# Patient Record
Sex: Male | Born: 1950
Health system: Southern US, Community
[De-identification: ages and names within clinical notes are randomized; demographics above are authoritative.]

## PROBLEM LIST (undated history)

## (undated) DIAGNOSIS — N200 Calculus of kidney: Secondary | ICD-10-CM

## (undated) HISTORY — PX: KNEE ARTHROSCOPY: SUR90

## (undated) HISTORY — PX: LITHOTRIPSY: SUR834

---

## 2003-12-28 ENCOUNTER — Encounter: Admission: RE | Admit: 2003-12-28 | Discharge: 2003-12-28 | Payer: Self-pay | Admitting: Urology

## 2003-12-29 ENCOUNTER — Ambulatory Visit (HOSPITAL_BASED_OUTPATIENT_CLINIC_OR_DEPARTMENT_OTHER): Admission: RE | Admit: 2003-12-29 | Discharge: 2003-12-29 | Payer: Self-pay | Admitting: Urology

## 2003-12-29 ENCOUNTER — Ambulatory Visit (HOSPITAL_COMMUNITY): Admission: RE | Admit: 2003-12-29 | Discharge: 2003-12-29 | Payer: Self-pay | Admitting: Urology

## 2004-01-01 ENCOUNTER — Emergency Department (HOSPITAL_COMMUNITY): Admission: EM | Admit: 2004-01-01 | Discharge: 2004-01-01 | Payer: Self-pay | Admitting: Emergency Medicine

## 2005-08-06 IMAGING — CR DG CHEST 2V
3 series · 3 of 3 positions shown · non-contrast
Comparison: none

CLINICAL DATA: Pre op renal calculi.

[view not recorded (1 of 3)]
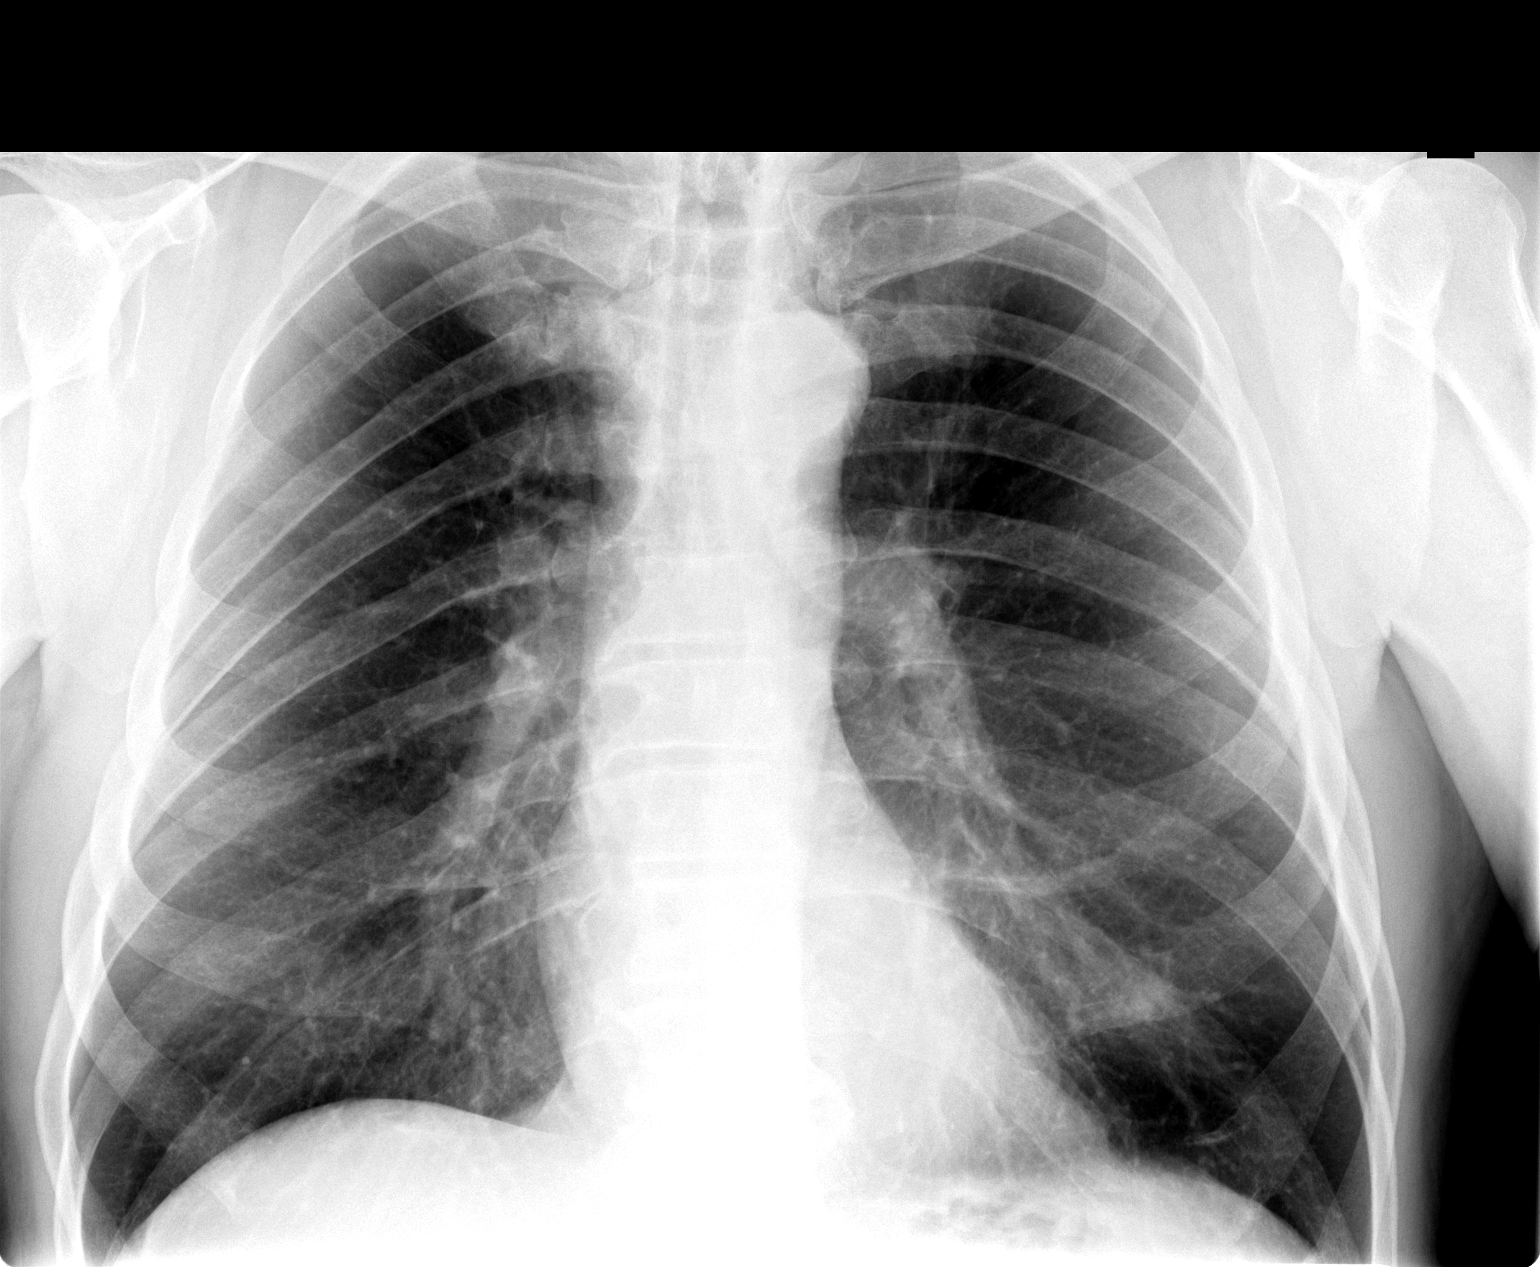

[view not recorded (2 of 3)]
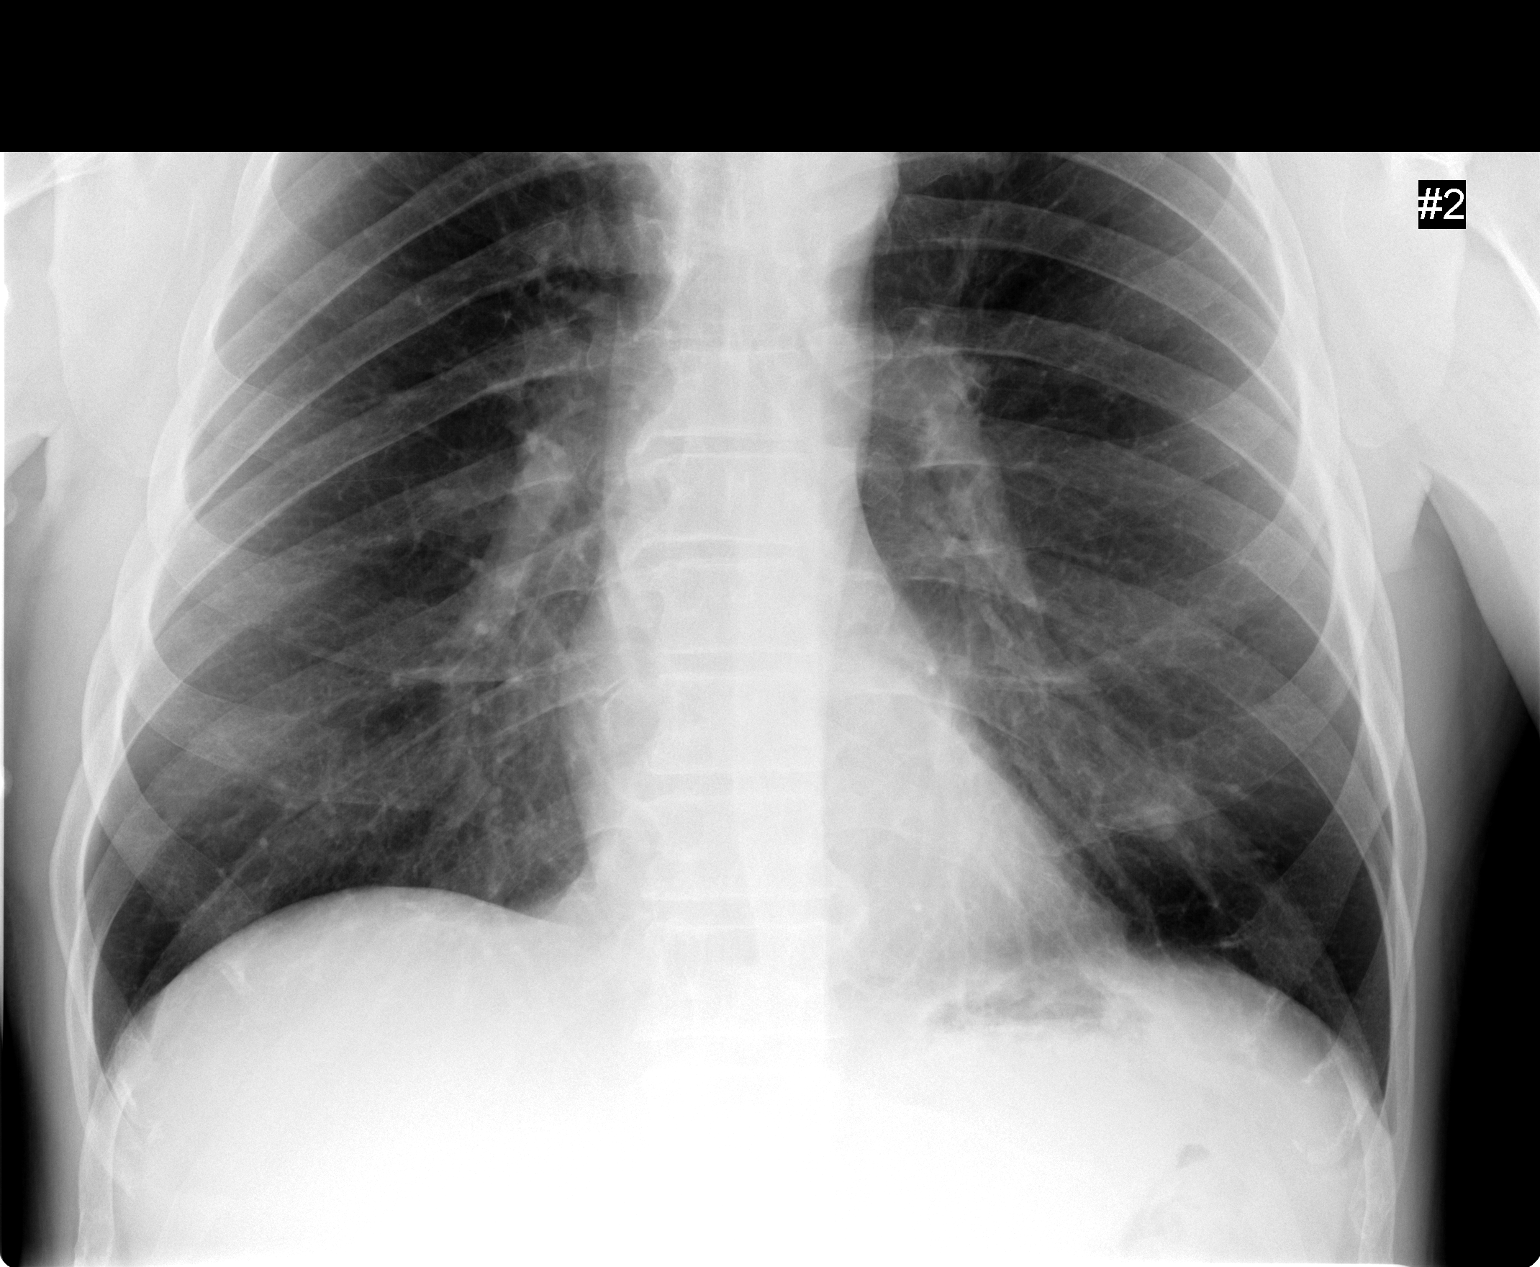

[view not recorded (3 of 3)]
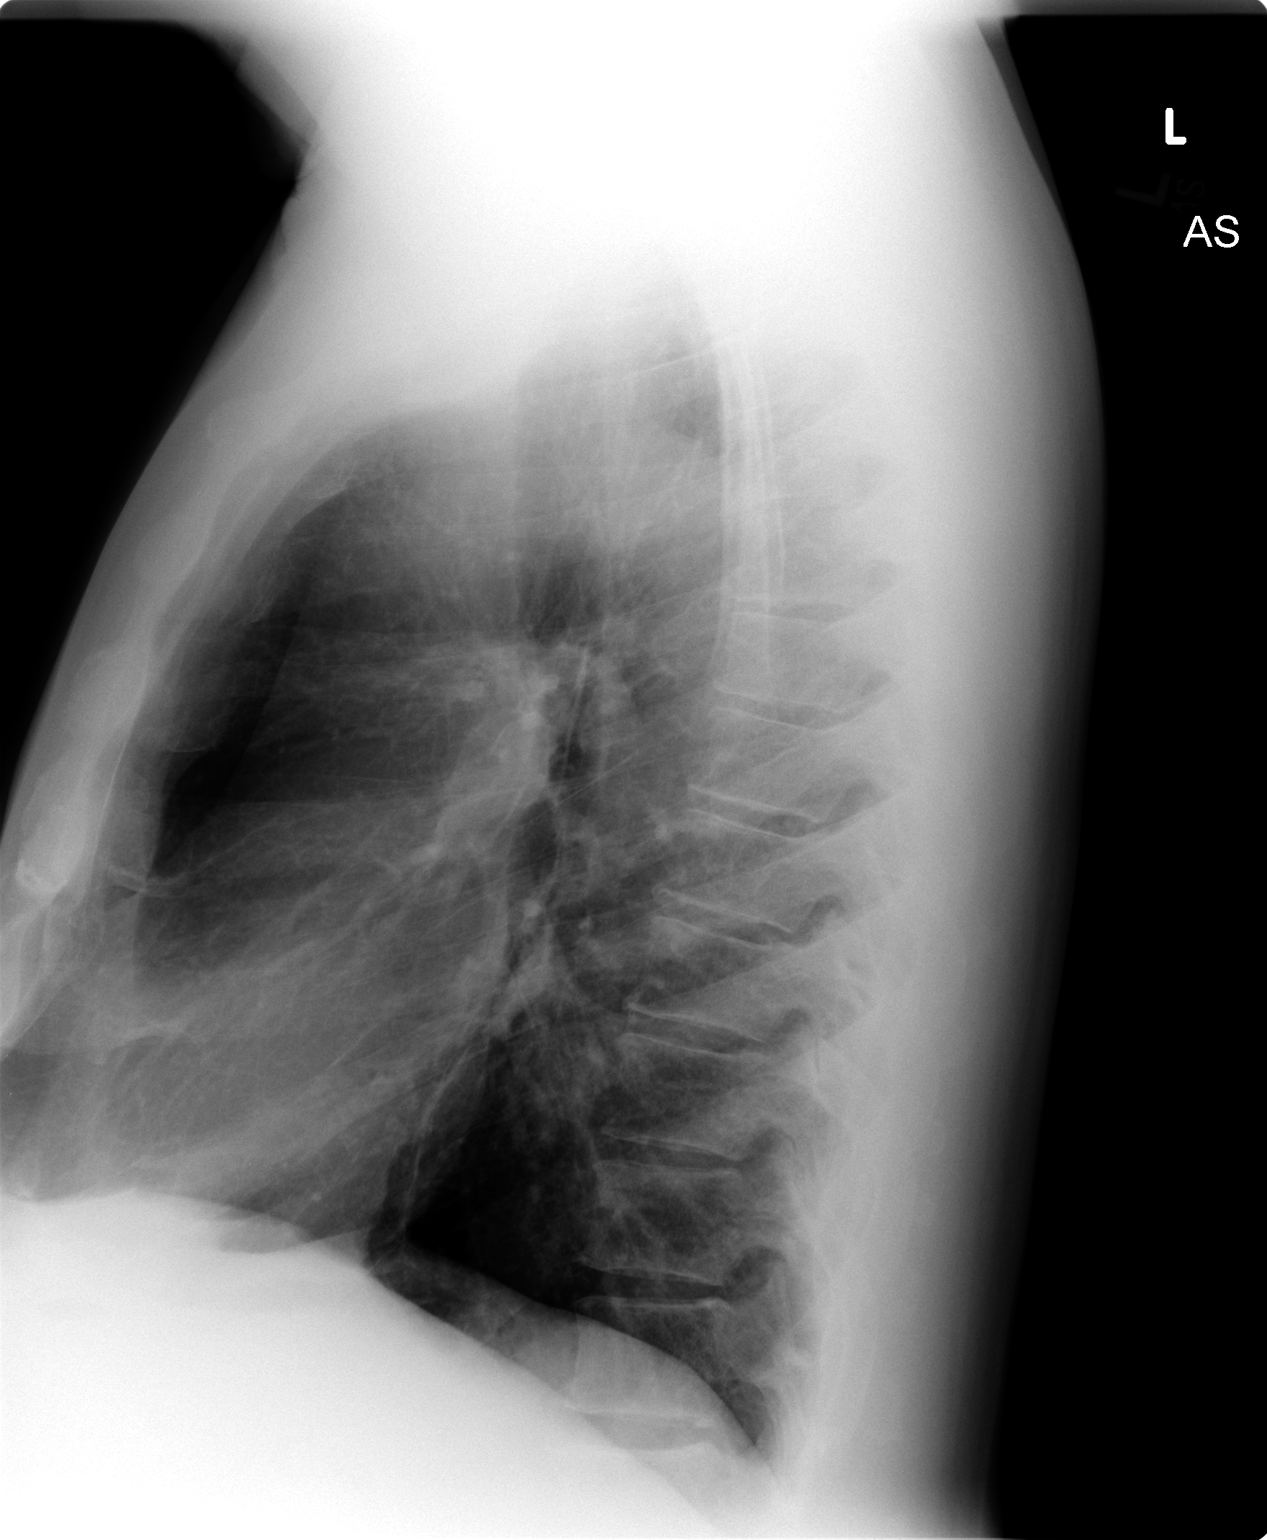

[3 of 3 positions shown; findings below may reference images not displayed]

TWO VIEW CHEST 
 Two view chest with no priors for comparison:

 Heart size and contour within normal limits.  Lungs mildly hyperaerated.  Probable left nipple shadow simulating lung nodule.  A PA film with nipple markers will be obtained since there are no priors.  

 IMPRESSION
 1.  Chronic lung changes.
 2.  Probable left nipple shadow simulating left lower lobe nodule.  Repeat PA with nipple markers will be done.

## 2005-08-06 IMAGING — CR DG CHEST SPECIAL VIEW
1 series · 1 of 1 positions shown · non-contrast
Comparison: none

CLINICAL DATA: Probable left nipple shadow simulated lung nodule on earlier PA and lateral chest x-ray. 
 PA CHEST W/NIPPLE MARKERS: 
 Repeat PA film with nipple markers confirms that the left nipple is the cause of the nodular shadow projecting over the left base.  No left lower lobe nodule.

[view not recorded]
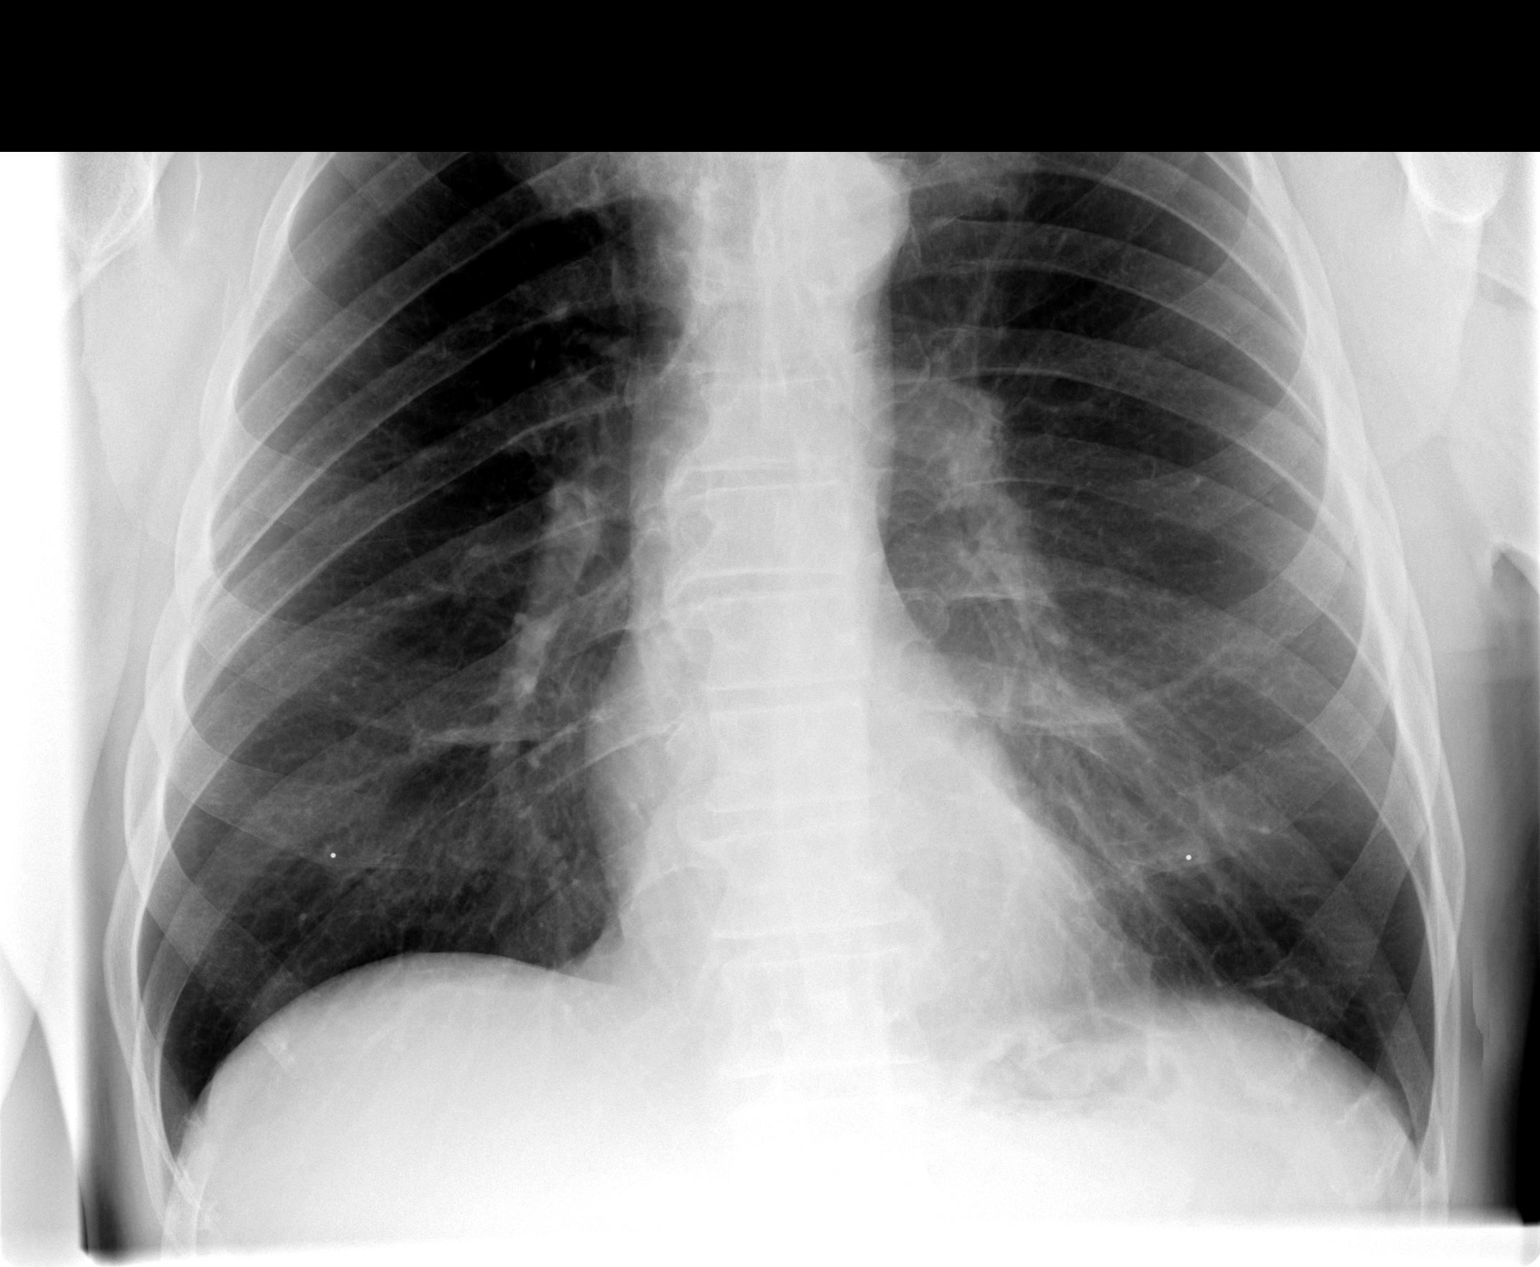

[1 of 1 positions shown; findings below may reference images not displayed]

IMPRESSION: The left nipple shadow simulates a left lower lobe nodule ? no active disease.

## 2005-08-10 IMAGING — CT CT PELVIS W/O CM
1 series · 15 of 32 positions shown, 19 images · non-contrast
Comparison: None.

CLINICAL DATA: Right flank pain, status-post right urinary tract calculus lithotripsy 3 days ago.  Now with gross hematuria.  Double J stent removed yesterday.  
CT OF THE ABDOMEN AND PELVIS, WITHOUT CONTRAST (URINARY CALCULUS PROTOCOL) ? 01/01/04

[Series 2: kidney sto 5.0 b30f · axial · 0.73mm/px · z∈[-616,-296]mm · 15 of 89 slices shown, 19 images]
[im 6/89  soft-tissue]
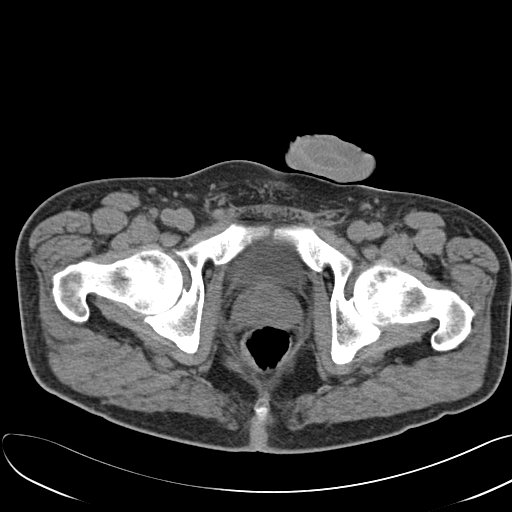
[im 6/89  bone]
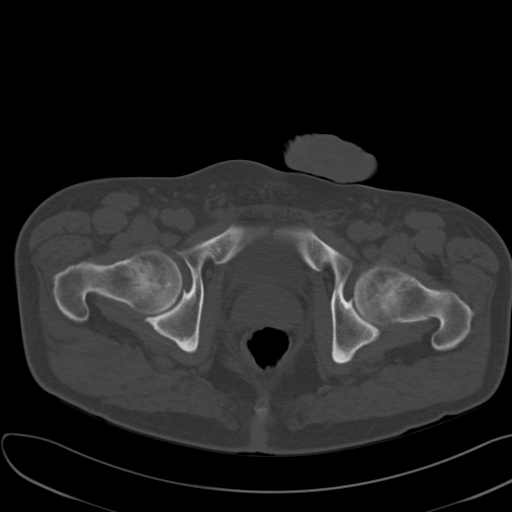
[im 12/89  soft-tissue]
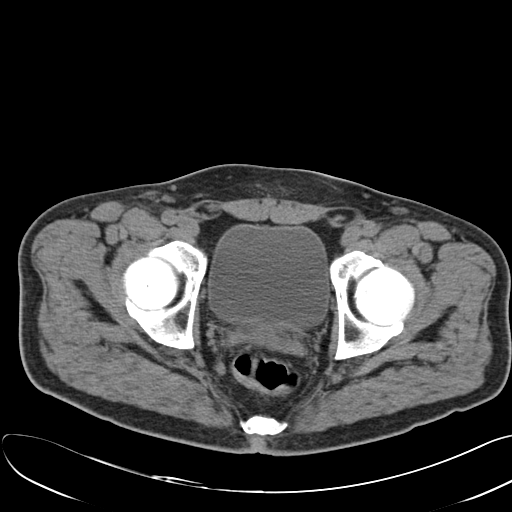
[im 18/89  soft-tissue]
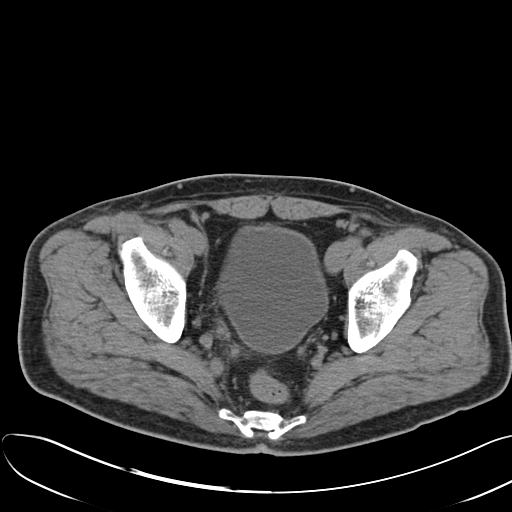
[im 26/89  soft-tissue]
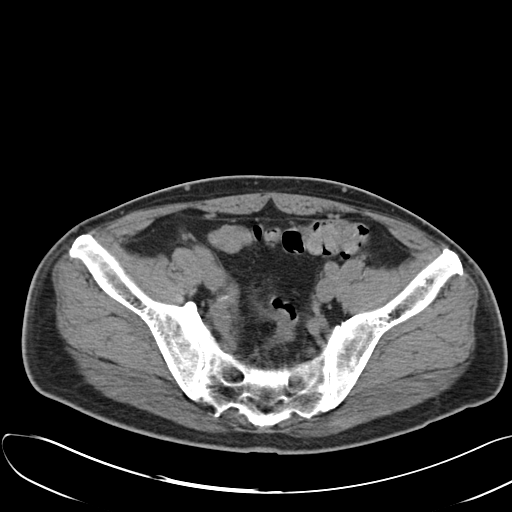
[im 32/89  soft-tissue]
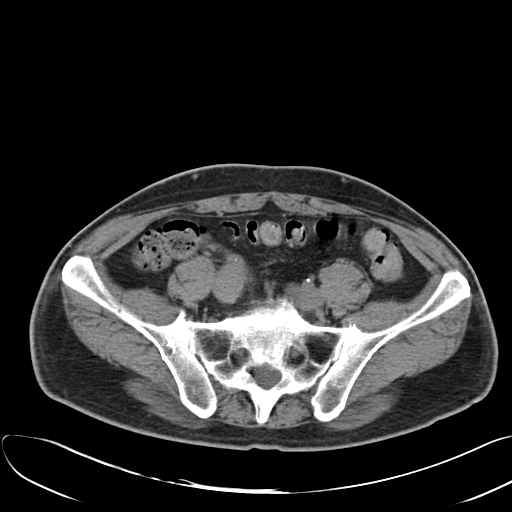
[im 37/89  soft-tissue]
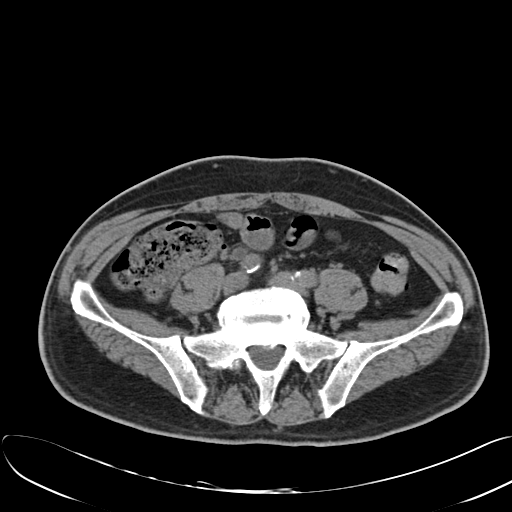
[im 46/89  soft-tissue]
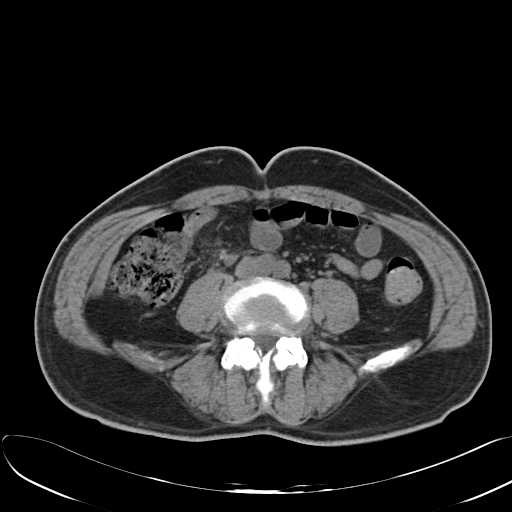
[im 52/89  soft-tissue]
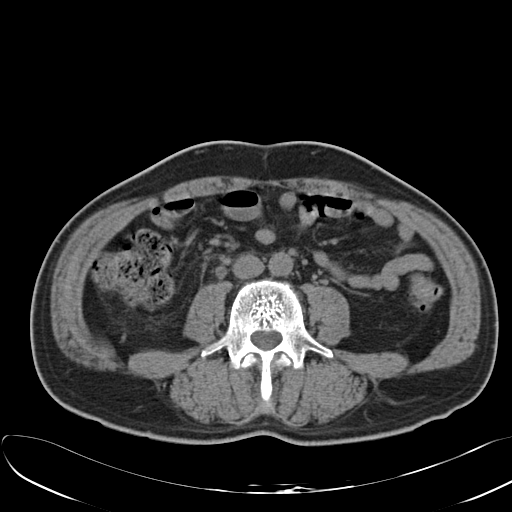
[im 57/89  soft-tissue]
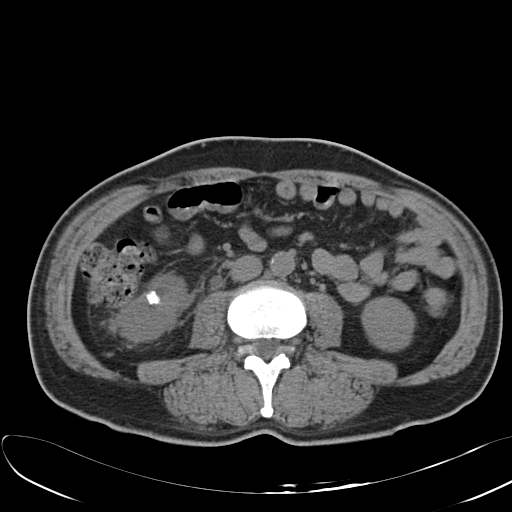
[im 57/89  bone]
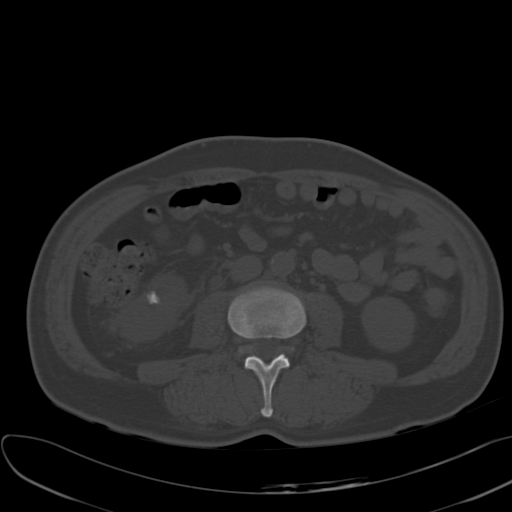
[im 63/89  soft-tissue]
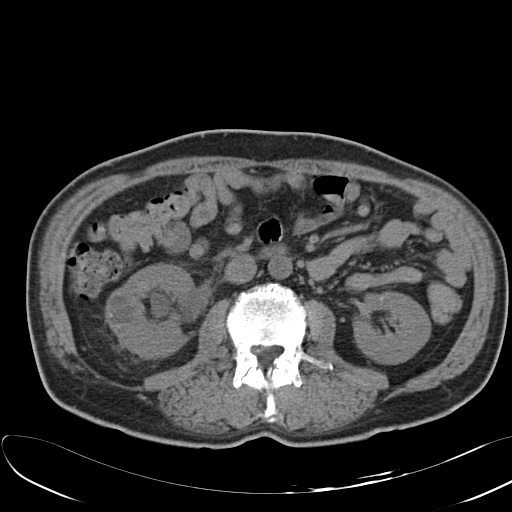
[im 71/89  soft-tissue]
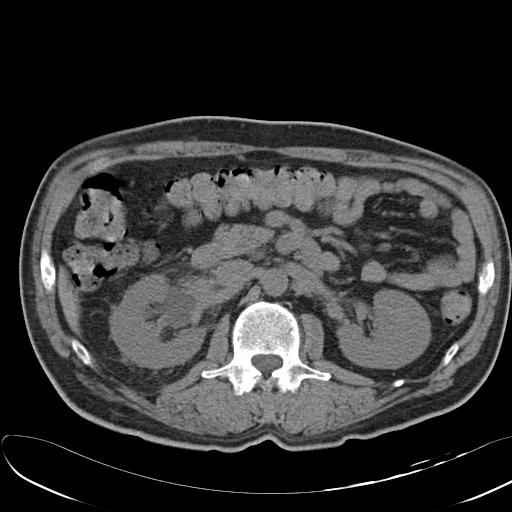
[im 77/89  soft-tissue]
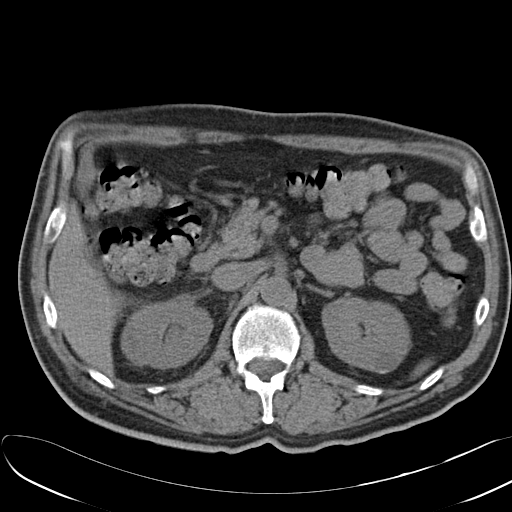
[im 77/89  lung]
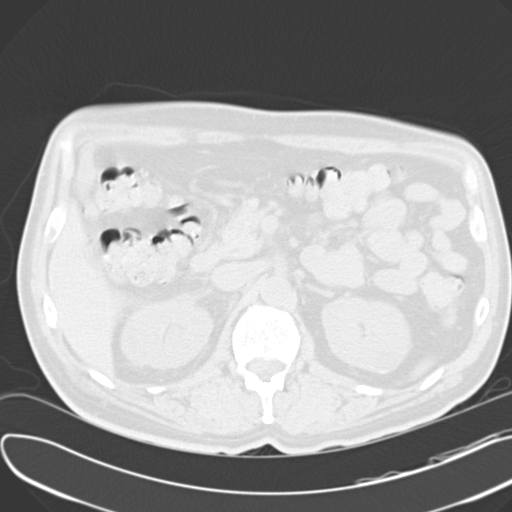
[im 80/89  lung]
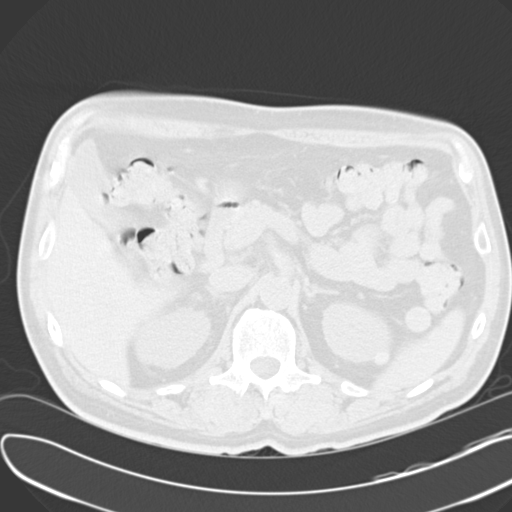
[im 83/89  soft-tissue]
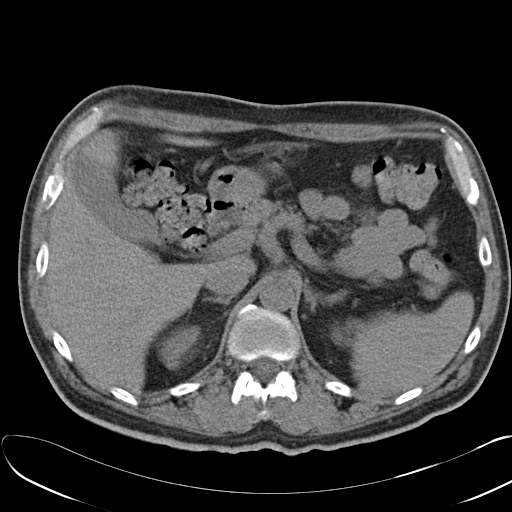
[im 83/89  lung]
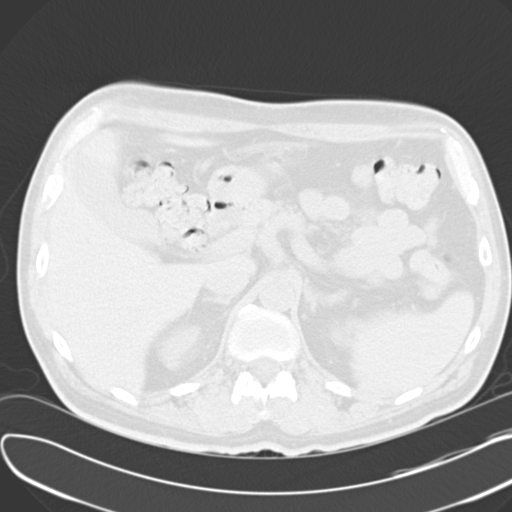
[im 86/89  lung]
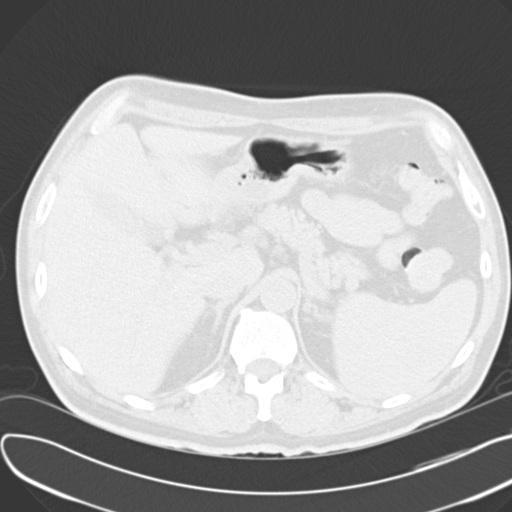

[15 of 32 positions shown; findings below may reference images not displayed]

CT ABDOMEN
There is evidence of moderate hydronephrosis involving the right kidney.  Numerous right renal stone fragments are present.  The proximal right ureter is dilated though there are no proximal ureteral calculi.  No left renal or ureteral calculi are identified. There is a hyperdense lesion arising from the upper pole of the left kidney with a Hounsfield unit measurement of 75; this measures approximately 1.2 x 1.1 cm in size and is likely to represent a hemorrhagic cyst.  There is an approximate 2 cm cyst in mid right kidney.  
There is a focus of accessory splenic tissue just medial to the lower pole of the spleen.  No significant abnormalities are identified within the visualized spleen for the unenhanced technique.  Mild aortic atherosclerosis is present.  The remaining visualized upper abdomen is unremarkable. 
IMPRESSION 
Moderate right hydronephrosis.  Gas bubbles are present within the right renal pelvis which would go along with the removal of the double J stent yesterday.  No proximal right ureteral calculi. 
Multiple stone fragments within right mid and lower pole calices.  
No left upper urinary tract calculi. 
Hyperdense 1.2 cm cyst arising from the upper pole of the left kidney. 

CT PELVIS
There is a small stone fragment present within the distal right ureter, and this stone is only on the order of 1-2 mm.  No other right ureteral calculi are identified.  Mild iliac atherosclerosis is present.  The urinary bladder has a small stone within it dependently.  There is also a gas bubble in the bladder consistent with the double J stent removal yesterday. The prostate gland is normal in size and appearance. The large and small bowel are unremarkable.  
IMPRESSION 
Tiny (2mm) distal right ureteral calculus. 
Tiny stone dependently in the bladder.

## 2008-06-16 DIAGNOSIS — I10 Essential (primary) hypertension: Secondary | ICD-10-CM

## 2008-06-16 HISTORY — DX: Essential (primary) hypertension: I10

## 2008-08-03 DIAGNOSIS — E782 Mixed hyperlipidemia: Secondary | ICD-10-CM

## 2008-08-03 HISTORY — DX: Mixed hyperlipidemia: E78.2

## 2010-06-26 DIAGNOSIS — R7301 Impaired fasting glucose: Secondary | ICD-10-CM | POA: Insufficient documentation

## 2010-06-26 DIAGNOSIS — E119 Type 2 diabetes mellitus without complications: Secondary | ICD-10-CM | POA: Insufficient documentation

## 2010-06-26 HISTORY — DX: Impaired fasting glucose: R73.01

## 2011-03-26 LAB — HM COLONOSCOPY

## 2013-10-13 ENCOUNTER — Other Ambulatory Visit: Payer: Self-pay | Admitting: Urology

## 2013-10-21 ENCOUNTER — Encounter (HOSPITAL_BASED_OUTPATIENT_CLINIC_OR_DEPARTMENT_OTHER): Payer: Self-pay | Admitting: *Deleted

## 2013-10-22 DIAGNOSIS — N200 Calculus of kidney: Secondary | ICD-10-CM | POA: Insufficient documentation

## 2013-10-28 ENCOUNTER — Ambulatory Visit (HOSPITAL_BASED_OUTPATIENT_CLINIC_OR_DEPARTMENT_OTHER): Admission: RE | Admit: 2013-10-28 | Payer: Self-pay | Source: Ambulatory Visit | Admitting: Urology

## 2013-10-28 ENCOUNTER — Encounter (HOSPITAL_BASED_OUTPATIENT_CLINIC_OR_DEPARTMENT_OTHER): Admission: RE | Payer: Self-pay | Source: Ambulatory Visit

## 2013-10-28 HISTORY — DX: Calculus of kidney: N20.0

## 2013-10-28 HISTORY — PX: CYSTOSCOPY W/ URETERAL STENT PLACEMENT: SHX1429

## 2013-10-28 SURGERY — CYSTOURETEROSCOPY, WITH RETROGRADE PYELOGRAM AND STENT INSERTION
Anesthesia: General | Laterality: Right

## 2013-11-09 ENCOUNTER — Encounter (HOSPITAL_BASED_OUTPATIENT_CLINIC_OR_DEPARTMENT_OTHER): Admission: RE | Payer: Self-pay | Source: Ambulatory Visit

## 2013-11-09 ENCOUNTER — Ambulatory Visit (HOSPITAL_BASED_OUTPATIENT_CLINIC_OR_DEPARTMENT_OTHER): Admission: RE | Admit: 2013-11-09 | Payer: Self-pay | Source: Ambulatory Visit | Admitting: Urology

## 2013-11-09 SURGERY — CYSTOURETEROSCOPY, WITH RETROGRADE PYELOGRAM AND STENT INSERTION
Anesthesia: General | Laterality: Right

## 2015-09-26 DIAGNOSIS — R7301 Impaired fasting glucose: Secondary | ICD-10-CM | POA: Diagnosis not present

## 2015-09-26 DIAGNOSIS — E782 Mixed hyperlipidemia: Secondary | ICD-10-CM | POA: Diagnosis not present

## 2015-09-26 DIAGNOSIS — I10 Essential (primary) hypertension: Secondary | ICD-10-CM | POA: Diagnosis not present

## 2015-12-04 DIAGNOSIS — J069 Acute upper respiratory infection, unspecified: Secondary | ICD-10-CM | POA: Diagnosis not present

## 2016-03-24 DIAGNOSIS — A849 Tick-borne viral encephalitis, unspecified: Secondary | ICD-10-CM | POA: Diagnosis not present

## 2016-03-24 DIAGNOSIS — S2096XA Insect bite (nonvenomous) of unspecified parts of thorax, initial encounter: Secondary | ICD-10-CM | POA: Diagnosis not present

## 2016-03-24 DIAGNOSIS — M150 Primary generalized (osteo)arthritis: Secondary | ICD-10-CM | POA: Diagnosis not present

## 2016-03-26 DIAGNOSIS — Z23 Encounter for immunization: Secondary | ICD-10-CM | POA: Diagnosis not present

## 2016-03-26 DIAGNOSIS — E782 Mixed hyperlipidemia: Secondary | ICD-10-CM | POA: Diagnosis not present

## 2016-03-26 DIAGNOSIS — I10 Essential (primary) hypertension: Secondary | ICD-10-CM | POA: Diagnosis not present

## 2016-03-26 DIAGNOSIS — M25562 Pain in left knee: Secondary | ICD-10-CM | POA: Diagnosis not present

## 2016-03-26 DIAGNOSIS — M25511 Pain in right shoulder: Secondary | ICD-10-CM | POA: Diagnosis not present

## 2016-03-26 DIAGNOSIS — R7301 Impaired fasting glucose: Secondary | ICD-10-CM | POA: Diagnosis not present

## 2016-03-26 DIAGNOSIS — M25512 Pain in left shoulder: Secondary | ICD-10-CM | POA: Diagnosis not present

## 2016-04-21 DIAGNOSIS — J189 Pneumonia, unspecified organism: Secondary | ICD-10-CM | POA: Diagnosis not present

## 2016-05-23 DIAGNOSIS — M1712 Unilateral primary osteoarthritis, left knee: Secondary | ICD-10-CM | POA: Diagnosis not present

## 2016-05-23 DIAGNOSIS — M25562 Pain in left knee: Secondary | ICD-10-CM | POA: Diagnosis not present

## 2016-05-24 DIAGNOSIS — E663 Overweight: Secondary | ICD-10-CM | POA: Diagnosis not present

## 2016-05-24 DIAGNOSIS — B351 Tinea unguium: Secondary | ICD-10-CM | POA: Diagnosis not present

## 2016-05-24 DIAGNOSIS — Z6832 Body mass index (BMI) 32.0-32.9, adult: Secondary | ICD-10-CM | POA: Diagnosis not present

## 2016-05-24 DIAGNOSIS — Z0001 Encounter for general adult medical examination with abnormal findings: Secondary | ICD-10-CM | POA: Diagnosis not present

## 2016-06-25 DIAGNOSIS — M1712 Unilateral primary osteoarthritis, left knee: Secondary | ICD-10-CM | POA: Diagnosis not present

## 2016-07-02 DIAGNOSIS — M19012 Primary osteoarthritis, left shoulder: Secondary | ICD-10-CM | POA: Diagnosis not present

## 2016-07-02 DIAGNOSIS — M19011 Primary osteoarthritis, right shoulder: Secondary | ICD-10-CM | POA: Diagnosis not present

## 2016-08-24 DIAGNOSIS — R7301 Impaired fasting glucose: Secondary | ICD-10-CM | POA: Diagnosis not present

## 2016-08-24 DIAGNOSIS — I10 Essential (primary) hypertension: Secondary | ICD-10-CM | POA: Diagnosis not present

## 2016-08-24 DIAGNOSIS — E782 Mixed hyperlipidemia: Secondary | ICD-10-CM | POA: Diagnosis not present

## 2016-10-15 DIAGNOSIS — H25813 Combined forms of age-related cataract, bilateral: Secondary | ICD-10-CM | POA: Diagnosis not present

## 2016-10-22 DIAGNOSIS — R05 Cough: Secondary | ICD-10-CM | POA: Diagnosis not present

## 2016-10-22 DIAGNOSIS — R509 Fever, unspecified: Secondary | ICD-10-CM | POA: Diagnosis not present

## 2016-10-26 HISTORY — PX: EYE SURGERY: SHX253

## 2016-10-29 DIAGNOSIS — H2512 Age-related nuclear cataract, left eye: Secondary | ICD-10-CM | POA: Diagnosis not present

## 2016-10-29 DIAGNOSIS — H53001 Unspecified amblyopia, right eye: Secondary | ICD-10-CM | POA: Diagnosis not present

## 2016-10-29 DIAGNOSIS — H2511 Age-related nuclear cataract, right eye: Secondary | ICD-10-CM | POA: Diagnosis not present

## 2016-10-29 DIAGNOSIS — H02831 Dermatochalasis of right upper eyelid: Secondary | ICD-10-CM | POA: Diagnosis not present

## 2016-11-15 DIAGNOSIS — H2511 Age-related nuclear cataract, right eye: Secondary | ICD-10-CM | POA: Diagnosis not present

## 2016-11-15 DIAGNOSIS — H25811 Combined forms of age-related cataract, right eye: Secondary | ICD-10-CM | POA: Diagnosis not present

## 2016-11-25 HISTORY — PX: EYE SURGERY: SHX253

## 2016-12-03 DIAGNOSIS — R7301 Impaired fasting glucose: Secondary | ICD-10-CM | POA: Diagnosis not present

## 2016-12-03 DIAGNOSIS — E782 Mixed hyperlipidemia: Secondary | ICD-10-CM | POA: Diagnosis not present

## 2016-12-03 DIAGNOSIS — I10 Essential (primary) hypertension: Secondary | ICD-10-CM | POA: Diagnosis not present

## 2016-12-13 DIAGNOSIS — H25812 Combined forms of age-related cataract, left eye: Secondary | ICD-10-CM | POA: Diagnosis not present

## 2016-12-13 DIAGNOSIS — H2512 Age-related nuclear cataract, left eye: Secondary | ICD-10-CM | POA: Diagnosis not present

## 2016-12-14 DIAGNOSIS — I10 Essential (primary) hypertension: Secondary | ICD-10-CM | POA: Diagnosis not present

## 2016-12-14 DIAGNOSIS — E782 Mixed hyperlipidemia: Secondary | ICD-10-CM | POA: Diagnosis not present

## 2016-12-14 DIAGNOSIS — R7301 Impaired fasting glucose: Secondary | ICD-10-CM | POA: Diagnosis not present

## 2016-12-17 ENCOUNTER — Other Ambulatory Visit: Payer: Self-pay

## 2016-12-31 DIAGNOSIS — Z961 Presence of intraocular lens: Secondary | ICD-10-CM | POA: Diagnosis not present

## 2017-03-15 DIAGNOSIS — I10 Essential (primary) hypertension: Secondary | ICD-10-CM | POA: Diagnosis not present

## 2017-03-15 DIAGNOSIS — E782 Mixed hyperlipidemia: Secondary | ICD-10-CM | POA: Diagnosis not present

## 2017-03-15 DIAGNOSIS — R7301 Impaired fasting glucose: Secondary | ICD-10-CM | POA: Diagnosis not present

## 2017-03-15 DIAGNOSIS — Z6831 Body mass index (BMI) 31.0-31.9, adult: Secondary | ICD-10-CM | POA: Diagnosis not present

## 2017-03-15 DIAGNOSIS — Z23 Encounter for immunization: Secondary | ICD-10-CM | POA: Diagnosis not present

## 2017-03-15 DIAGNOSIS — E668 Other obesity: Secondary | ICD-10-CM | POA: Diagnosis not present

## 2017-03-15 DIAGNOSIS — B351 Tinea unguium: Secondary | ICD-10-CM | POA: Diagnosis not present

## 2017-08-19 DIAGNOSIS — I10 Essential (primary) hypertension: Secondary | ICD-10-CM | POA: Diagnosis not present

## 2017-08-19 DIAGNOSIS — M19012 Primary osteoarthritis, left shoulder: Secondary | ICD-10-CM | POA: Diagnosis not present

## 2017-08-19 DIAGNOSIS — E782 Mixed hyperlipidemia: Secondary | ICD-10-CM | POA: Diagnosis not present

## 2017-08-19 DIAGNOSIS — E668 Other obesity: Secondary | ICD-10-CM | POA: Diagnosis not present

## 2017-08-19 DIAGNOSIS — Z0001 Encounter for general adult medical examination with abnormal findings: Secondary | ICD-10-CM | POA: Diagnosis not present

## 2017-08-19 DIAGNOSIS — Z6831 Body mass index (BMI) 31.0-31.9, adult: Secondary | ICD-10-CM | POA: Diagnosis not present

## 2017-08-19 DIAGNOSIS — R7301 Impaired fasting glucose: Secondary | ICD-10-CM | POA: Diagnosis not present

## 2017-08-19 DIAGNOSIS — Z125 Encounter for screening for malignant neoplasm of prostate: Secondary | ICD-10-CM | POA: Diagnosis not present

## 2017-09-06 DIAGNOSIS — I10 Essential (primary) hypertension: Secondary | ICD-10-CM | POA: Diagnosis not present

## 2017-09-06 DIAGNOSIS — N200 Calculus of kidney: Secondary | ICD-10-CM | POA: Diagnosis not present

## 2017-09-06 DIAGNOSIS — N400 Enlarged prostate without lower urinary tract symptoms: Secondary | ICD-10-CM | POA: Diagnosis not present

## 2017-09-07 DIAGNOSIS — S161XXA Strain of muscle, fascia and tendon at neck level, initial encounter: Secondary | ICD-10-CM | POA: Diagnosis not present

## 2017-10-12 DIAGNOSIS — J069 Acute upper respiratory infection, unspecified: Secondary | ICD-10-CM | POA: Diagnosis not present

## 2017-10-12 DIAGNOSIS — R05 Cough: Secondary | ICD-10-CM | POA: Diagnosis not present

## 2017-10-18 DIAGNOSIS — Z961 Presence of intraocular lens: Secondary | ICD-10-CM | POA: Diagnosis not present

## 2017-11-16 DIAGNOSIS — J0100 Acute maxillary sinusitis, unspecified: Secondary | ICD-10-CM | POA: Diagnosis not present

## 2017-11-16 DIAGNOSIS — J069 Acute upper respiratory infection, unspecified: Secondary | ICD-10-CM | POA: Diagnosis not present

## 2017-12-20 DIAGNOSIS — R05 Cough: Secondary | ICD-10-CM | POA: Diagnosis not present

## 2017-12-20 DIAGNOSIS — J301 Allergic rhinitis due to pollen: Secondary | ICD-10-CM | POA: Diagnosis not present

## 2018-02-21 DIAGNOSIS — R05 Cough: Secondary | ICD-10-CM | POA: Diagnosis not present

## 2018-02-21 DIAGNOSIS — Z23 Encounter for immunization: Secondary | ICD-10-CM | POA: Diagnosis not present

## 2018-02-21 DIAGNOSIS — R7301 Impaired fasting glucose: Secondary | ICD-10-CM | POA: Diagnosis not present

## 2018-02-21 DIAGNOSIS — Z6831 Body mass index (BMI) 31.0-31.9, adult: Secondary | ICD-10-CM | POA: Diagnosis not present

## 2018-02-21 DIAGNOSIS — E668 Other obesity: Secondary | ICD-10-CM | POA: Diagnosis not present

## 2018-02-21 DIAGNOSIS — E782 Mixed hyperlipidemia: Secondary | ICD-10-CM | POA: Diagnosis not present

## 2018-02-21 DIAGNOSIS — I10 Essential (primary) hypertension: Secondary | ICD-10-CM | POA: Diagnosis not present

## 2018-06-13 DIAGNOSIS — M7060 Trochanteric bursitis, unspecified hip: Secondary | ICD-10-CM | POA: Diagnosis not present

## 2018-07-07 DIAGNOSIS — M25511 Pain in right shoulder: Secondary | ICD-10-CM | POA: Diagnosis not present

## 2018-07-12 DIAGNOSIS — M25511 Pain in right shoulder: Secondary | ICD-10-CM | POA: Diagnosis not present

## 2018-07-12 DIAGNOSIS — M19011 Primary osteoarthritis, right shoulder: Secondary | ICD-10-CM | POA: Diagnosis not present

## 2018-07-14 DIAGNOSIS — M25511 Pain in right shoulder: Secondary | ICD-10-CM | POA: Diagnosis not present

## 2018-07-14 DIAGNOSIS — M19011 Primary osteoarthritis, right shoulder: Secondary | ICD-10-CM | POA: Diagnosis not present

## 2018-07-14 DIAGNOSIS — M7540 Impingement syndrome of unspecified shoulder: Secondary | ICD-10-CM | POA: Diagnosis not present

## 2018-07-15 DIAGNOSIS — M25511 Pain in right shoulder: Secondary | ICD-10-CM | POA: Diagnosis not present

## 2018-07-15 DIAGNOSIS — M6281 Muscle weakness (generalized): Secondary | ICD-10-CM | POA: Diagnosis not present

## 2018-07-15 DIAGNOSIS — M19011 Primary osteoarthritis, right shoulder: Secondary | ICD-10-CM | POA: Diagnosis not present

## 2018-08-01 DIAGNOSIS — M7060 Trochanteric bursitis, unspecified hip: Secondary | ICD-10-CM | POA: Diagnosis not present

## 2018-08-01 DIAGNOSIS — M7062 Trochanteric bursitis, left hip: Secondary | ICD-10-CM | POA: Diagnosis not present

## 2018-08-15 DIAGNOSIS — M19012 Primary osteoarthritis, left shoulder: Secondary | ICD-10-CM | POA: Insufficient documentation

## 2018-08-25 DIAGNOSIS — Z0001 Encounter for general adult medical examination with abnormal findings: Secondary | ICD-10-CM | POA: Diagnosis not present

## 2018-08-25 DIAGNOSIS — M1712 Unilateral primary osteoarthritis, left knee: Secondary | ICD-10-CM | POA: Insufficient documentation

## 2018-08-25 DIAGNOSIS — M19011 Primary osteoarthritis, right shoulder: Secondary | ICD-10-CM | POA: Diagnosis not present

## 2018-10-31 DIAGNOSIS — E782 Mixed hyperlipidemia: Secondary | ICD-10-CM | POA: Diagnosis not present

## 2018-10-31 DIAGNOSIS — R7301 Impaired fasting glucose: Secondary | ICD-10-CM | POA: Diagnosis not present

## 2018-10-31 DIAGNOSIS — M79605 Pain in left leg: Secondary | ICD-10-CM | POA: Diagnosis not present

## 2018-10-31 DIAGNOSIS — I10 Essential (primary) hypertension: Secondary | ICD-10-CM | POA: Diagnosis not present

## 2018-10-31 DIAGNOSIS — Z6830 Body mass index (BMI) 30.0-30.9, adult: Secondary | ICD-10-CM | POA: Diagnosis not present

## 2018-12-01 DIAGNOSIS — N400 Enlarged prostate without lower urinary tract symptoms: Secondary | ICD-10-CM | POA: Diagnosis not present

## 2018-12-01 DIAGNOSIS — N200 Calculus of kidney: Secondary | ICD-10-CM | POA: Diagnosis not present

## 2019-05-04 DIAGNOSIS — Z6830 Body mass index (BMI) 30.0-30.9, adult: Secondary | ICD-10-CM | POA: Diagnosis not present

## 2019-05-04 DIAGNOSIS — I10 Essential (primary) hypertension: Secondary | ICD-10-CM | POA: Diagnosis not present

## 2019-05-04 DIAGNOSIS — Z23 Encounter for immunization: Secondary | ICD-10-CM | POA: Diagnosis not present

## 2019-05-04 DIAGNOSIS — R7301 Impaired fasting glucose: Secondary | ICD-10-CM | POA: Diagnosis not present

## 2019-05-04 DIAGNOSIS — E782 Mixed hyperlipidemia: Secondary | ICD-10-CM | POA: Diagnosis not present

## 2019-09-25 ENCOUNTER — Ambulatory Visit (INDEPENDENT_AMBULATORY_CARE_PROVIDER_SITE_OTHER): Payer: Medicare Other

## 2019-09-25 ENCOUNTER — Other Ambulatory Visit: Payer: Self-pay

## 2019-09-25 DIAGNOSIS — Z23 Encounter for immunization: Secondary | ICD-10-CM

## 2019-09-25 DIAGNOSIS — Z0000 Encounter for general adult medical examination without abnormal findings: Secondary | ICD-10-CM

## 2019-09-25 NOTE — Progress Notes (Signed)
Subjective:   Joel Harding is a 69 y.o. male who presents for Medicare Annual/Subsequent preventive examination.  This wellness visit is conducted by a nurse.  The patient's medications were reviewed and reconciled since the patient's last visit.  History details were provided by the patient.  The history appears to be reliable.    Patient's last AWV was one year ago.   Medical History: Patient history and Family history was reviewed  Medications, Allergies, and preventative health maintenance was reviewed and updated.  Review of Systems:  Review of Systems  Constitutional: Negative.   HENT: Positive for hearing loss. Negative for congestion and sinus pain.        Hearing loss corrected with bilateral hearing aids  Eyes: Negative.  Negative for blurred vision and double vision.  Respiratory: Negative.  Negative for cough and shortness of breath.   Cardiovascular: Negative.  Negative for chest pain, palpitations and leg swelling.  Gastrointestinal: Negative.  Negative for diarrhea, nausea and vomiting.  Musculoskeletal: Negative.  Negative for back pain and falls.  Neurological: Negative.  Negative for dizziness, weakness and headaches.  Psychiatric/Behavioral: Negative.  Negative for depression, substance abuse and suicidal ideas.   Cardiac Risk Factors include: advanced age (>77men, >52 women);obesity (BMI >30kg/m2);male gender;hypertension     Objective:    Vitals: BP 138/80 (BP Location: Left Arm, Patient Position: Sitting, Cuff Size: Large)   Pulse 66   Temp 97.8 F (36.6 C) (Temporal)   Resp 16   Ht 6' (1.829 m)   Wt 230 lb (104.3 kg)   SpO2 96%   BMI 31.19 kg/m   Body mass index is 31.19 kg/m.  Advanced Directives 09/25/2019  Does Patient Have a Medical Advance Directive? No  Would patient like information on creating a medical advance directive? Yes (MAU/Ambulatory/Procedural Areas - Information given)    Tobacco Social History   Tobacco Use  Smoking Status  Former Smoker  . Types: Cigarettes  . Quit date: 06/05/1979  . Years since quitting: 40.3  Smokeless Tobacco Never Used     Clinical Intake:  Pre-visit preparation completed: Yes  Pain : No/denies pain Pain Score: 0-No pain     BMI - recorded: 31.19 Nutritional Status: BMI > 30  Obese Nutritional Risks: Other (Comment) Diabetes: No  How often do you need to have someone help you when you read instructions, pamphlets, or other written materials from your doctor or pharmacy?: 1 - Never  Interpreter Needed?: No     Past Medical History:  Diagnosis Date  . Hypertension, essential 06/16/2008  . Impaired fasting glucose 06/26/2010  . Mixed hyperlipidemia 08/03/2008  . Right nephrolithiasis    Past Surgical History:  Procedure Laterality Date  . CYSTOSCOPY W/ URETERAL STENT PLACEMENT Right 10/28/2013  . EYE SURGERY Left 11/2016   Cataract  . EYE SURGERY Right 10/2016   Cataract  . KNEE ARTHROSCOPY Left   . LITHOTRIPSY     Family History  Problem Relation Age of Onset  . Cancer Mother   . Heart attack Father    Social History   Socioeconomic History  . Marital status: Married    Spouse name: Not on file  . Number of children: Not on file  . Years of education: Not on file  . Highest education level: Not on file  Occupational History  . Occupation: [employer] doctor  Tobacco Use  . Smoking status: Former Smoker    Types: Cigarettes    Quit date: 06/05/1979    Years since  quitting: 40.3  . Smokeless tobacco: Never Used  Substance and Sexual Activity  . Alcohol use: Never  . Drug use: Never  . Sexual activity: Not on file  Other Topics Concern  . Not on file  Social History Narrative  . Not on file   Social Determinants of Health   Financial Resource Strain:   . Difficulty of Paying Living Expenses:   Food Insecurity:   . Worried About Charity fundraiser in the Last Year:   . Arboriculturist in the Last Year:   Transportation Needs: No Transportation Needs  .  Lack of Transportation (Medical): No  . Lack of Transportation (Non-Medical): No  Physical Activity:   . Days of Exercise per Week:   . Minutes of Exercise per Session:   Stress:   . Feeling of Stress :   Social Connections:   . Frequency of Communication with Friends and Family:   . Frequency of Social Gatherings with Friends and Family:   . Attends Religious Services:   . Active Member of Clubs or Organizations:   . Attends Archivist Meetings:   Marland Kitchen Marital Status:     Outpatient Encounter Medications as of 09/25/2019  Medication Sig  . cyclobenzaprine (FLEXERIL) 10 MG tablet Take 10 mg by mouth 3 (three) times daily.  . famciclovir (FAMVIR) 250 MG tablet Take 250 mg by mouth 2 (two) times daily.  . Omega-3 Fatty Acids (FISH OIL) 1000 MG CAPS Take 1,000 mg by mouth 2 (two) times daily.  . simvastatin (ZOCOR) 40 MG tablet Take 40 mg by mouth daily.  . valsartan (DIOVAN) 160 MG tablet Take 160 mg by mouth daily.  . meloxicam (MOBIC) 15 MG tablet Take 15 mg by mouth daily.  . [DISCONTINUED] valsartan (DIOVAN) 160 MG tablet Take 160 mg by mouth daily.   No facility-administered encounter medications on file as of 09/25/2019.    Activities of Daily Living In your present state of health, do you have any difficulty performing the following activities: 09/25/2019  Hearing? Y  Comment corrected with hearing aids  Vision? N  Difficulty concentrating or making decisions? N  Walking or climbing stairs? N  Dressing or bathing? N  Doing errands, shopping? N  Preparing Food and eating ? N  Using the Toilet? N  In the past six months, have you accidently leaked urine? N  Do you have problems with loss of bowel control? N  Managing your Medications? N  Managing your Finances? N  Housekeeping or managing your Housekeeping? N  Some recent data might be hidden    Patient Care Team: Rochel Brome, MD as PCP - General (Family Medicine) Creig Hines, MD as Consulting Physician  (Orthopedic Surgery) Nehemiah Settle, MD (Gastroenterology)   Assessment:   This is a routine wellness examination for Joel Harding.  Exercise Activities and Dietary recommendations Current Exercise Habits: The patient does not participate in regular exercise at present, Exercise limited by: None identified  Goals    . Client understands the importance of follow-up with providers by attending scheduled visits       Fall Risk Fall Risk  09/25/2019 12/17/2016  Falls in the past year? 0 No  Comment - Emmi Telephone Survey: data to providers prior to load   Is the patient's home free of loose throw rugs in walkways, pet beds, electrical cords, etc?   yes      Grab bars in the bathroom? no      Handrails on the  stairs?   yes      Adequate lighting?   yes   Depression Screen PHQ 2/9 Scores 09/25/2019  PHQ - 2 Score 0    Cognitive Function MMSE - Mini Mental State Exam 09/25/2019  Orientation to time 5  Orientation to Place 5  Registration 3  Attention/ Calculation 5  Recall 3  Language- name 2 objects 2  Language- repeat 1  Language- follow 3 step command 3  Language- read & follow direction 1  Write a sentence 1  Copy design 1  Total score 30        Immunization History  Administered Date(s) Administered  . Influenza-Unspecified 05/04/2019  . Pneumococcal Conjugate-13 11/22/2014  . Pneumococcal Polysaccharide-23 06/30/2012, 09/25/2019  . Tdap 11/22/2014    Qualifies for Shingles Vaccine? Yes, patient will get from pharmacy  Screening Tests Health Maintenance  Topic Date Due  . Hepatitis C Screening  Never done  . INFLUENZA VACCINE  12/27/2019  . COLONOSCOPY  04/09/2021  . TETANUS/TDAP  11/21/2024  . PNA vac Low Risk Adult  Completed  . COVID-19 Vaccine  Discontinued   Cancer Screenings: Lung: Low Dose CT Chest recommended if Age 65-80 years, 30 pack-year currently smoking OR have quit w/in 15years. Patient does not qualify. Colorectal: Screening due in 03/2021        Plan:   Counseling was provided today regarding the following topics: healthy eating habits, home safety, vitamin and mineral supplementation (calcium and Vit D), regular exercise, tobacco avoidance, limitation of alcohol intake, use of seat belts, firearm safety, and fall prevention.  Annual recommendations include: influenza vaccine, dental cleanings, and eye exams.  Patient was given information about advanced directive - he will complete and bring to office to put on file.  I have personally reviewed and noted the following in the patient's chart:   . Medical and social history . Use of alcohol, tobacco or illicit drugs  . Current medications and supplements . Functional ability and status . Nutritional status . Physical activity . Advanced directives . List of other physicians . Hospitalizations, surgeries, and ER visits in previous 12 months . Vitals . Screenings to include cognitive, depression, and falls . Referrals and appointments  In addition, I have reviewed and discussed with patient certain preventive protocols, quality metrics, and best practice recommendations. A written personalized care plan for preventive services as well as general preventive health recommendations were provided to patient.     Jacklynn Bue, LPN  [id number]

## 2019-09-25 NOTE — Patient Instructions (Signed)
 Fall Prevention in the Home, Adult Falls can cause injuries. They can happen to people of all ages. There are many things you can do to make your home safe and to help prevent falls. Ask for help when making these changes, if needed. What actions can I take to prevent falls? General Instructions  Use good lighting in all rooms. Replace any light bulbs that burn out.  Turn on the lights when you go into a dark area. Use night-lights.  Keep items that you use often in easy-to-reach places. Lower the shelves around your home if necessary.  Set up your furniture so you have a clear path. Avoid moving your furniture around.  Do not have throw rugs and other things on the floor that can make you trip.  Avoid walking on wet floors.  If any of your floors are uneven, fix them.  Add color or contrast paint or tape to clearly mark and help you see: ? Any grab bars or handrails. ? First and last steps of stairways. ? Where the edge of each step is.  If you use a stepladder: ? Make sure that it is fully opened. Do not climb a closed stepladder. ? Make sure that both sides of the stepladder are locked into place. ? Ask someone to hold the stepladder for you while you use it.  If there are any pets around you, be aware of where they are. What can I do in the bathroom?      Keep the floor dry. Clean up any water that spills onto the floor as soon as it happens.  Remove soap buildup in the tub or shower regularly.  Use non-skid mats or decals on the floor of the tub or shower.  Attach bath mats securely with double-sided, non-slip rug tape.  If you need to sit down in the shower, use a plastic, non-slip stool.  Install grab bars by the toilet and in the tub and shower. Do not use towel bars as grab bars. What can I do in the bedroom?  Make sure that you have a light by your bed that is easy to reach.  Do not use any sheets or blankets that are too big for your bed. They should  not hang down onto the floor.  Have a firm chair that has side arms. You can use this for support while you get dressed. What can I do in the kitchen?  Clean up any spills right away.  If you need to reach something above you, use a strong step stool that has a grab bar.  Keep electrical cords out of the way.  Do not use floor polish or wax that makes floors slippery. If you must use wax, use non-skid floor wax. What can I do with my stairs?  Do not leave any items on the stairs.  Make sure that you have a light switch at the top of the stairs and the bottom of the stairs. If you do not have them, ask someone to add them for you.  Make sure that there are handrails on both sides of the stairs, and use them. Fix handrails that are broken or loose. Make sure that handrails are as long as the stairways.  Install non-slip stair treads on all stairs in your home.  Avoid having throw rugs at the top or bottom of the stairs. If you do have throw rugs, attach them to the floor with carpet tape.  Choose a carpet that   does not hide the edge of the steps on the stairway.  Check any carpeting to make sure that it is firmly attached to the stairs. Fix any carpet that is loose or worn. What can I do on the outside of my home?  Use bright outdoor lighting.  Regularly fix the edges of walkways and driveways and fix any cracks.  Remove anything that might make you trip as you walk through a door, such as a raised step or threshold.  Trim any bushes or trees on the path to your home.  Regularly check to see if handrails are loose or broken. Make sure that both sides of any steps have handrails.  Install guardrails along the edges of any raised decks and porches.  Clear walking paths of anything that might make someone trip, such as tools or rocks.  Have any leaves, snow, or ice cleared regularly.  Use sand or salt on walking paths during winter.  Clean up any spills in your garage right  away. This includes grease or oil spills. What other actions can I take?  Wear shoes that: ? Have a low heel. Do not wear high heels. ? Have rubber bottoms. ? Are comfortable and fit you well. ? Are closed at the toe. Do not wear open-toe sandals.  Use tools that help you move around (mobility aids) if they are needed. These include: ? Canes. ? Walkers. ? Scooters. ? Crutches.  Review your medicines with your doctor. Some medicines can make you feel dizzy. This can increase your chance of falling. Ask your doctor what other things you can do to help prevent falls. Where to find more information  Centers for Disease Control and Prevention, [employer]: https://cdc.gov  National Institute on Aging: https://go4life.nia.nih.gov Contact a doctor if:  You are afraid of falling at home.  You feel weak, drowsy, or dizzy at home.  You fall at home. Summary  There are many simple things that you can do to make your home safe and to help prevent falls.  Ways to make your home safe include removing tripping hazards and installing grab bars in the bathroom.  Ask for help when making these changes in your home. This information is not intended to replace advice given to you by your health care provider. Make sure you discuss any questions you have with your health care provider. Document Revised: 09/04/2018 Document Reviewed: 12/27/2016 Elsevier Patient Education  2020 Elsevier Inc.   Health Maintenance, Male Adopting a healthy lifestyle and getting preventive care are important in promoting health and wellness. Ask your health care provider about:  The right schedule for you to have regular tests and exams.  Things you can do on your own to prevent diseases and keep yourself healthy. What should I know about diet, weight, and exercise? Eat a healthy diet   Eat a diet that includes plenty of vegetables, fruits, low-fat dairy products, and lean protein.  Do not eat a lot of foods  that are high in solid fats, added sugars, or sodium. Maintain a healthy weight Body mass index (BMI) is a measurement that can be used to identify possible weight problems. It estimates body fat based on height and weight. Your health care provider can help determine your BMI and help you achieve or maintain a healthy weight. Get regular exercise Get regular exercise. This is one of the most important things you can do for your health. Most adults should:  Exercise for at least 150 minutes each week. The   exercise should increase your heart rate and make you sweat (moderate-intensity exercise).  Do strengthening exercises at least twice a week. This is in addition to the moderate-intensity exercise.  Spend less time sitting. Even light physical activity can be beneficial. Watch cholesterol and blood lipids Have your blood tested for lipids and cholesterol at 69 years of age, then have this test every 5 years. You may need to have your cholesterol levels checked more often if:  Your lipid or cholesterol levels are high.  You are older than 69 years of age.  You are at high risk for heart disease. What should I know about cancer screening? Many types of cancers can be detected early and may often be prevented. Depending on your health history and family history, you may need to have cancer screening at various ages. This may include screening for:  Colorectal cancer.  Prostate cancer.  Skin cancer.  Lung cancer. What should I know about heart disease, diabetes, and high blood pressure? Blood pressure and heart disease  High blood pressure causes heart disease and increases the risk of stroke. This is more likely to develop in people who have high blood pressure readings, are of African descent, or are overweight.  Talk with your health care provider about your target blood pressure readings.  Have your blood pressure checked: ? Every 3-5 years if you are 18-39 years of  age. ? Every year if you are 40 years old or older.  If you are between the ages of 65 and 75 and are a current or former smoker, ask your health care provider if you should have a one-time screening for abdominal aortic aneurysm (AAA). Diabetes Have regular diabetes screenings. This checks your fasting blood sugar level. Have the screening done:  Once every three years after age 45 if you are at a normal weight and have a low risk for diabetes.  More often and at a younger age if you are overweight or have a high risk for diabetes. What should I know about preventing infection? Hepatitis B If you have a higher risk for hepatitis B, you should be screened for this virus. Talk with your health care provider to find out if you are at risk for hepatitis B infection. Hepatitis C Blood testing is recommended for:  Everyone born from 1945 through 1965.  Anyone with known risk factors for hepatitis C. Sexually transmitted infections (STIs)  You should be screened each year for STIs, including gonorrhea and chlamydia, if: ? You are sexually active and are younger than 69 years of age. ? You are older than 69 years of age and your health care provider tells you that you are at risk for this type of infection. ? Your sexual activity has changed since you were last screened, and you are at increased risk for chlamydia or gonorrhea. Ask your health care provider if you are at risk.  Ask your health care provider about whether you are at high risk for HIV. Your health care provider may recommend a prescription medicine to help prevent HIV infection. If you choose to take medicine to prevent HIV, you should first get tested for HIV. You should then be tested every 3 months for as long as you are taking the medicine. Follow these instructions at home: Lifestyle  Do not use any products that contain nicotine or tobacco, such as cigarettes, e-cigarettes, and chewing tobacco. If you need help quitting,  ask your health care provider.  Do not use street   drugs.  Do not share needles.  Ask your health care provider for help if you need support or information about quitting drugs. Alcohol use  Do not drink alcohol if your health care provider tells you not to drink.  If you drink alcohol: ? Limit how much you have to 0-2 drinks a day. ? Be aware of how much alcohol is in your drink. In the [place] one drink equals one 12 oz bottle of beer (355 mL), one 5 oz glass of wine (148 mL), or one 1 oz glass of hard liquor (44 mL). General instructions  Schedule regular health, dental, and eye exams.  Stay current with your vaccines.  Tell your health care provider if: ? You often feel depressed. ? You have ever been abused or do not feel safe at home. Summary  Adopting a healthy lifestyle and getting preventive care are important in promoting health and wellness.  Follow your health care provider's instructions about healthy diet, exercising, and getting tested or screened for diseases.  Follow your health care provider's instructions on monitoring your cholesterol and blood pressure. This information is not intended to replace advice given to you by your health care provider. Make sure you discuss any questions you have with your health care provider. Document Revised: 05/07/2018 Document Reviewed: 05/07/2018 Elsevier Patient Education  2020 [employer] Inc.  

## 2019-10-29 NOTE — Progress Notes (Signed)
Subjective:  Patient ID: Joel Harding, male    DOB: Nov 29, 1950  Age: 69 y.o. MRN: [id number]  Chief Complaint  Patient presents with   Hyperlipidemia   Hypertension   Prediabetes    HPI  Pt presents for follow up of hypertension.  His current cardiac medication regimen includes an angiotensin receptor blocker ( Diovan ).  Review of his blood pressure log reveals systolics in the 850Y and diastolics in the 77A.      Pt presents with hyperlipidemia.  Current treatment includes Zocor.  Compliance with treatment has been good; he takes his medication as directed, maintains his low cholesterol diet, follows up as directed, and maintains his exercise regimen.  He denies experiencing any hypercholesterolemia related symptoms.      Joel Harding presents with a diagnosis of impaired fasting glucose.  Patient tries to eat healthy.  Past Medical History:  Diagnosis Date   Hypertension, essential 06/16/2008   Impaired fasting glucose 06/26/2010   Mixed hyperlipidemia 08/03/2008   Right nephrolithiasis    Past Surgical History:  Procedure Laterality Date   CYSTOSCOPY W/ URETERAL STENT PLACEMENT Right 10/28/2013   EYE SURGERY Left 11/2016   Cataract   EYE SURGERY Right 10/2016   Cataract   KNEE ARTHROSCOPY Left    LITHOTRIPSY      Family History  Problem Relation Age of Onset   Cancer Mother    Heart attack Father    Social History   Socioeconomic History   Marital status: Married    Spouse name: Not on file   Number of children: Not on file   Years of education: Not on file   Highest education level: Not on file  Occupational History   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  Tobacco Use   Smoking status: Former Smoker    Types: Cigarettes    Quit date: 06/05/1979    Years since quitting: 40.4   Smokeless tobacco: Never Used  Substance and Sexual Activity   Alcohol use: Never   Drug use: Never   Sexual activity: Not on file  Other Topics Concern   Not on file  Social  History Narrative   Not on file   Social Determinants of Health   Financial Resource Strain:    Difficulty of Paying Living Expenses:   Food Insecurity:    Worried About Charity fundraiser in the Last Year:    Arboriculturist in the Last Year:   Transportation Needs: No Transportation Needs   Lack of Transportation (Medical): No   Lack of Transportation (Non-Medical): No  Physical Activity:    Days of Exercise per Week:    Minutes of Exercise per Session:   Stress:    Feeling of Stress :   Social Connections:    Frequency of Communication with Friends and Family:    Frequency of Social Gatherings with Friends and Family:    Attends Religious Services:    Active Member of Clubs or Organizations:    Attends Archivist Meetings:    Marital Status:     Review of Systems  Constitutional: Negative for chills, fatigue and fever.  HENT: Negative for congestion, ear pain and rhinorrhea.   Respiratory: Negative for cough and shortness of breath.   Cardiovascular: Negative for chest pain and palpitations.  Gastrointestinal: Negative for abdominal pain, constipation, diarrhea, nausea and vomiting.  Genitourinary: Negative for dysuria and frequency.  Musculoskeletal: Positive for back pain. Negative for arthralgias.  Neurological: Negative for weakness and headaches.  Objective:  BP 110/82    Pulse 60    Temp (!) 97.4 F (36.3 C)    Resp 18    Ht 6' (1.829 m)    Wt 237 lb (107.5 kg)    BMI 32.14 kg/m   BP/Weight 11/02/2019 09/25/2019  Systolic BP 110 138  Diastolic BP 82 80  Wt. (Lbs) 237 230  BMI 32.14 31.19    Physical Exam Vitals reviewed.  Constitutional:      Appearance: Normal appearance.  Neck:     Vascular: No carotid bruit.  Cardiovascular:     Rate and Rhythm: Normal rate and regular rhythm.     Pulses: Normal pulses.     Heart sounds: Normal heart sounds.  Pulmonary:     Effort: Pulmonary effort is normal.     Breath sounds:  Normal breath sounds. No wheezing, rhonchi or rales.  Abdominal:     General: Bowel sounds are normal.     Palpations: Abdomen is soft.     Tenderness: There is no abdominal tenderness.  Neurological:     Mental Status: He is alert.  Psychiatric:        Mood and Affect: Mood normal.        Behavior: Behavior normal.     Diabetic Foot Exam - Simple   No data filed       No results found for: WBC, HGB, HCT, PLT, GLUCOSE, CHOL, TRIG, HDL, LDLDIRECT, LDLCALC, ALT, AST, NA, K, CL, CREATININE, BUN, CO2, TSH, PSA, INR, GLUF, HGBA1C, MICROALBUR    Assessment & Plan:  1. Hypertension, essential Well controlled.  No changes to medicines.  Continue to work on eating a healthy diet and exercise.  Labs drawn today.  - Comprehensive metabolic panel  2. Impaired fasting glucose Recommend continue to work on eating healthy diet and exercise. - CBC with Differential/Platelet - Hemoglobin A1c  3. Mixed hyperlipidemia Recommend continue to work on eating healthy diet and exercise. - Lipid panel  4. Benign prostatic hyperplasia without lower urinary tract symptoms - PSA  5. Class 1 obesity due to excess calories with serious comorbidity and body mass index (BMI) of 32.0 to 32.9 in adult Recommend continue to work on eating healthy diet and exercise.   Well controlled.  No changes to medicines.  Continue to work on eating a healthy diet and exercise.  Labs drawn today.     Follow-up: No follow-ups on file.  An After Visit Summary was printed and given to the patient.  Blane Ohara [facility] [phone]

## 2019-11-02 ENCOUNTER — Encounter: Payer: Self-pay | Admitting: Family Medicine

## 2019-11-02 ENCOUNTER — Other Ambulatory Visit: Payer: Self-pay

## 2019-11-02 ENCOUNTER — Ambulatory Visit (INDEPENDENT_AMBULATORY_CARE_PROVIDER_SITE_OTHER): Payer: Medicare Other | Admitting: Family Medicine

## 2019-11-02 VITALS — BP 110/82 | HR 60 | Temp 97.4°F | Resp 18 | Ht 72.0 in | Wt 237.0 lb

## 2019-11-02 DIAGNOSIS — N400 Enlarged prostate without lower urinary tract symptoms: Secondary | ICD-10-CM | POA: Diagnosis not present

## 2019-11-02 DIAGNOSIS — I10 Essential (primary) hypertension: Secondary | ICD-10-CM | POA: Diagnosis not present

## 2019-11-02 DIAGNOSIS — Z6832 Body mass index (BMI) 32.0-32.9, adult: Secondary | ICD-10-CM | POA: Insufficient documentation

## 2019-11-02 DIAGNOSIS — E782 Mixed hyperlipidemia: Secondary | ICD-10-CM

## 2019-11-02 DIAGNOSIS — R7301 Impaired fasting glucose: Secondary | ICD-10-CM

## 2019-11-02 DIAGNOSIS — E6609 Other obesity due to excess calories: Secondary | ICD-10-CM | POA: Diagnosis not present

## 2019-11-02 MED ORDER — VALSARTAN 160 MG PO TABS: 160.0000 mg | ORAL_TABLET | Freq: Every day | ORAL | 1 refills | Status: DC

## 2019-11-02 MED ORDER — SIMVASTATIN 40 MG PO TABS: 40.0000 mg | ORAL_TABLET | Freq: Every day | ORAL | 1 refills | Status: DC

## 2019-11-02 MED ORDER — FAMCICLOVIR 250 MG PO TABS: 250.0000 mg | ORAL_TABLET | Freq: Two times a day (BID) | ORAL | 1 refills | Status: DC

## 2019-11-02 NOTE — Patient Instructions (Signed)
No changes to medicines.   

## 2019-11-03 LAB — CBC WITH DIFFERENTIAL/PLATELET
Basophils Absolute: 0 10*3/uL (ref 0.0–0.2)
Basos: 1 %
EOS (ABSOLUTE): 0.1 10*3/uL (ref 0.0–0.4)
Eos: 2 %
Hematocrit: 46.3 % (ref 37.5–51.0)
Hemoglobin: 15.9 g/dL (ref 13.0–17.7)
Immature Grans (Abs): 0 10*3/uL (ref 0.0–0.1)
Immature Granulocytes: 0 %
Lymphocytes Absolute: 1.6 10*3/uL (ref 0.7–3.1)
Lymphs: 25 %
MCH: 31.8 pg (ref 26.6–33.0)
MCHC: 34.3 g/dL (ref 31.5–35.7)
MCV: 93 fL (ref 79–97)
Monocytes Absolute: 0.5 10*3/uL (ref 0.1–0.9)
Monocytes: 8 %
Neutrophils Absolute: 4.2 10*3/uL (ref 1.4–7.0)
Neutrophils: 64 %
Platelets: 181 10*3/uL (ref 150–450)
RBC: 5 x10E6/uL (ref 4.14–5.80)
RDW: 13.3 % (ref 11.6–15.4)
WBC: 6.4 10*3/uL (ref 3.4–10.8)

## 2019-11-03 LAB — COMPREHENSIVE METABOLIC PANEL
ALT: 20 IU/L (ref 0–44)
AST: 20 IU/L (ref 0–40)
Albumin/Globulin Ratio: 2.6 — ABNORMAL HIGH (ref 1.2–2.2)
Albumin: 4.4 g/dL (ref 3.8–4.8)
Alkaline Phosphatase: 110 IU/L (ref 48–121)
BUN/Creatinine Ratio: 17 (ref 10–24)
BUN: 21 mg/dL (ref 8–27)
Bilirubin Total: 0.4 mg/dL (ref 0.0–1.2)
CO2: 24 mmol/L (ref 20–29)
Calcium: 9.4 mg/dL (ref 8.6–10.2)
Chloride: 106 mmol/L (ref 96–106)
Creatinine, Ser: 1.23 mg/dL (ref 0.76–1.27)
GFR calc Af Amer: 69 mL/min/{1.73_m2} (ref >59)
GFR calc non Af Amer: 60 mL/min/{1.73_m2} (ref >59)
Globulin, Total: 1.7 g/dL (ref 1.5–4.5)
Glucose: 105 mg/dL — ABNORMAL HIGH (ref 65–99)
Potassium: 5.1 mmol/L (ref 3.5–5.2)
Sodium: 142 mmol/L (ref 134–144)
Total Protein: 6.1 g/dL (ref 6.0–8.5)

## 2019-11-03 LAB — LIPID PANEL
Chol/HDL Ratio: 3.1 ratio (ref 0.0–5.0)
Cholesterol, Total: 139 mg/dL (ref 100–199)
HDL: 45 mg/dL (ref >39)
LDL Chol Calc (NIH): 74 mg/dL (ref 0–99)
Triglycerides: 108 mg/dL (ref 0–149)
VLDL Cholesterol Cal: 20 mg/dL (ref 5–40)

## 2019-11-03 LAB — CARDIOVASCULAR RISK ASSESSMENT

## 2019-11-03 LAB — PSA: Prostate Specific Ag, Serum: 0.9 ng/mL (ref 0.0–4.0)

## 2019-11-03 LAB — HEMOGLOBIN A1C
Est. average glucose Bld gHb Est-mCnc: 120 mg/dL
Hgb A1c MFr Bld: 5.8 % — ABNORMAL HIGH (ref 4.8–5.6)

## 2020-02-25 ENCOUNTER — Telehealth: Payer: Self-pay | Admitting: Family Medicine

## 2020-02-25 NOTE — Progress Notes (Signed)
Virtual Visit via Telephone Note  Started as telephone visit. When covid 19, flu A and B were all negative, I converted his visit to in house.   Date:  02/25/2020   ID:  Joel Harding, DOB 24-Jun-1950, MRN [id number]  PCP:  Blane Ohara, MD   Evaluation Performed: acute visit Chief Complaint:  fever  History of Present Illness:    Joel Harding is a 69 y.o. male complaining of having sob, fever (99.7), chest pain, and sweating x 3 hours. Shooting pains in his ear. Chest pain like a bronchitis not cardiac. No radiation. Pt is very hot. Complaining of headaches. NO coughing.  Mild congestion. Pain in shoulder (chronic) alternates shoulders.   The patient does have symptoms concerning for COVID-19 infection (fever, chills, cough, or new shortness of breath).    Past Medical History:  Diagnosis Date  . Hypertension, essential 06/16/2008  . Impaired fasting glucose 06/26/2010  . Mixed hyperlipidemia 08/03/2008  . Right nephrolithiasis     Past Surgical History:  Procedure Laterality Date  . CYSTOSCOPY W/ URETERAL STENT PLACEMENT Right 10/28/2013  . EYE SURGERY Left 11/2016   Cataract  . EYE SURGERY Right 10/2016   Cataract  . KNEE ARTHROSCOPY Left   . LITHOTRIPSY      Family History  Problem Relation Age of Onset  . Cancer Mother   . Heart attack Father     Social History   Socioeconomic History  . Marital status: Married    Spouse name: Not on file  . Number of children: Not on file  . Years of education: Not on file  . Highest education level: Not on file  Occupational History  . Occupation: Truck Hospital doctor  Tobacco Use  . Smoking status: Former Smoker    Types: Cigarettes    Quit date: 06/05/1979    Years since quitting: 40.7  . Smokeless tobacco: Never Used  Vaping Use  . Vaping Use: Never used  Substance and Sexual Activity  . Alcohol use: Never  . Drug use: Never  . Sexual activity: Not on file  Other Topics Concern  . Not on file  Social History  Narrative  . Not on file   Social Determinants of Health   Financial Resource Strain:   . Difficulty of Paying Living Expenses: Not on file  Food Insecurity:   . Worried About Programme researcher, broadcasting/film/video in the Last Year: Not on file  . Ran Out of Food in the Last Year: Not on file  Transportation Needs: No Transportation Needs  . Lack of Transportation (Medical): No  . Lack of Transportation (Non-Medical): No  Physical Activity:   . Days of Exercise per Week: Not on file  . Minutes of Exercise per Session: Not on file  Stress:   . Feeling of Stress : Not on file  Social Connections:   . Frequency of Communication with Friends and Family: Not on file  . Frequency of Social Gatherings with Friends and Family: Not on file  . Attends Religious Services: Not on file  . Active Member of Clubs or Organizations: Not on file  . Attends Banker Meetings: Not on file  . Marital Status: Not on file  Intimate Partner Violence: Not At Risk  . Fear of Current or Ex-Partner: No  . Emotionally Abused: No  . Physically Abused: No  . Sexually Abused: No    Outpatient Medications Prior to Visit  Medication Sig Dispense Refill  . cyclobenzaprine (FLEXERIL) 10  MG tablet Take 10 mg by mouth 3 (three) times daily.    . famciclovir (FAMVIR) 250 MG tablet Take 1 tablet (250 mg total) by mouth 2 (two) times daily. 180 tablet 1  . Omega-3 Fatty Acids (FISH OIL) 1000 MG CAPS Take 1,000 mg by mouth 2 (two) times daily.    . simvastatin (ZOCOR) 40 MG tablet Take 1 tablet (40 mg total) by mouth daily. 90 tablet 1  . valsartan (DIOVAN) 160 MG tablet Take 1 tablet (160 mg total) by mouth daily. 90 tablet 1   No facility-administered medications prior to visit.  Allergies:   Lisinopril   Social History   Tobacco Use  . Smoking status: Former Smoker    Types: Cigarettes    Quit date: 06/05/1979    Years since quitting: 40.7  . Smokeless tobacco: Never Used  Vaping Use  . Vaping Use: Never used    Substance Use Topics  . Alcohol use: Never  . Drug use: Never     Review of Systems  Constitutional: Positive for diaphoresis and fever.  HENT: Negative for congestion, ear pain and sore throat.   Respiratory: Positive for shortness of breath. Negative for cough.   Cardiovascular: Positive for chest pain.     Labs/Other Tests and Data Reviewed:    Recent Labs: 11/02/2019: ALT 20; BUN 21; Creatinine, Ser 1.23; Hemoglobin 15.9; Platelets 181; Potassium 5.1; Sodium 142   Recent Lipid Panel Lab Results  Component Value Date/Time   CHOL 139 11/02/2019 08:45 AM   TRIG 108 11/02/2019 08:45 AM   HDL 45 11/02/2019 08:45 AM   CHOLHDL 3.1 11/02/2019 08:45 AM   LDLCALC 74 11/02/2019 08:45 AM    Wt Readings from Last 3 Encounters:  11/02/19 237 lb (107.5 kg)  09/25/19 230 lb (104.3 kg)     Objective:    Vital Signs:  Pulse (!) 108   Temp 99.7 F (37.6 C)   SpO2 94%    Physical Exam Constitutional:      Appearance: Normal appearance.  HENT:     Right Ear: Tympanic membrane, ear canal and external ear normal.     Left Ear: Tympanic membrane, ear canal and external ear normal.     Nose: No congestion or rhinorrhea.     Mouth/Throat:     Mouth: Mucous membranes are moist.     Pharynx: No oropharyngeal exudate or posterior oropharyngeal erythema.  Cardiovascular:     Rate and Rhythm: Normal rate and regular rhythm.     Heart sounds: Normal heart sounds.  Pulmonary:     Effort: Pulmonary effort is normal. No respiratory distress.     Breath sounds: Rhonchi (LLL) present. No wheezing or rales.  Abdominal:     General: Bowel sounds are normal.     Palpations: Abdomen is soft.     Tenderness: There is no abdominal tenderness.  Lymphadenopathy:     Cervical: No cervical adenopathy.  Neurological:     Mental Status: He is alert.  Psychiatric:        Mood and Affect: Mood normal.        Behavior: Behavior normal.      ASSESSMENT & PLAN:   1. Fever, unspecified fever  cause - Novel Coronavirus, NAA ([facility]) sent out. - POC COVID-19 negative - Influenza Test (NEG for A and B)  2. Acute nonintractable headache, unspecified headache type  See above. tylenol 3. Shortness of breath - CBC with Differential/Platelet stat - Comprehensive metabolic panel stat - D-dimer,  quantitative (not at [facility]) stat - DG Chest 2 View stat - Novel Coronavirus, NAA ([facility]) stat  4. Other chest pain - Troponin T stat - EKG 12-Lead: NSR No ST changes.   COVID-19 Education: The signs and symptoms of COVID-19 were discussed with the patient and how to seek care for testing (follow up with PCP or arrange E-visit). The importance of social distancing was discussed today.  Follow Up: prn  Signed, Blane Ohara, MD  02/25/2020 9:32 PM    [facility] [place]

## 2020-02-25 NOTE — Telephone Encounter (Signed)
Patient has been having sob, fever (99.7), chest pain, sweating.  Shooting pains in his ear.  Pain in shoulder (chronic) alternates shoulders.  Complaining of headaches. NO coughing.  Mild congestion.   T: 99.7 Oxygen 94% P 108 BP:   Patient scheduled for virtual visit am with me. He will come at 8 am for covid 19 testing/flu testing. Kc

## 2020-02-26 ENCOUNTER — Other Ambulatory Visit: Payer: Self-pay

## 2020-02-26 ENCOUNTER — Encounter: Payer: Self-pay | Admitting: Family Medicine

## 2020-02-26 ENCOUNTER — Telehealth (INDEPENDENT_AMBULATORY_CARE_PROVIDER_SITE_OTHER): Payer: Medicare Other | Admitting: Family Medicine

## 2020-02-26 VITALS — HR 108 | Temp 99.7°F

## 2020-02-26 DIAGNOSIS — R509 Fever, unspecified: Secondary | ICD-10-CM

## 2020-02-26 DIAGNOSIS — R0602 Shortness of breath: Secondary | ICD-10-CM

## 2020-02-26 DIAGNOSIS — R519 Headache, unspecified: Secondary | ICD-10-CM

## 2020-02-26 DIAGNOSIS — J90 Pleural effusion, not elsewhere classified: Secondary | ICD-10-CM | POA: Diagnosis not present

## 2020-02-26 DIAGNOSIS — R0789 Other chest pain: Secondary | ICD-10-CM

## 2020-02-26 LAB — CBC WITH DIFFERENTIAL/PLATELET
Basophils Absolute: 0 10*3/uL (ref 0.0–0.2)
Basos: 0 %
EOS (ABSOLUTE): 0.1 10*3/uL (ref 0.0–0.4)
Eos: 1 %
Hematocrit: 45.6 % (ref 37.5–51.0)
Hemoglobin: 15.5 g/dL (ref 13.0–17.7)
Immature Grans (Abs): 0 10*3/uL (ref 0.0–0.1)
Immature Granulocytes: 0 %
Lymphocytes Absolute: 1.8 10*3/uL (ref 0.7–3.1)
Lymphs: 18 %
MCH: 31.6 pg (ref 26.6–33.0)
MCHC: 34 g/dL (ref 31.5–35.7)
MCV: 93 fL (ref 79–97)
Monocytes Absolute: 1 10*3/uL — ABNORMAL HIGH (ref 0.1–0.9)
Monocytes: 10 %
Neutrophils Absolute: 7 10*3/uL (ref 1.4–7.0)
Neutrophils: 71 %
Platelets: 179 10*3/uL (ref 150–450)
RBC: 4.91 x10E6/uL (ref 4.14–5.80)
RDW: 12.9 % (ref 11.6–15.4)
WBC: 10 10*3/uL (ref 3.4–10.8)

## 2020-02-26 LAB — COMPREHENSIVE METABOLIC PANEL
ALT: 12 IU/L (ref 0–44)
AST: 17 IU/L (ref 0–40)
Albumin/Globulin Ratio: 1.9 (ref 1.2–2.2)
Albumin: 4.1 g/dL (ref 3.8–4.8)
Alkaline Phosphatase: 105 IU/L (ref 44–121)
BUN/Creatinine Ratio: 14 (ref 10–24)
BUN: 16 mg/dL (ref 8–27)
Bilirubin Total: 0.8 mg/dL (ref 0.0–1.2)
CO2: 24 mmol/L (ref 20–29)
Calcium: 9.2 mg/dL (ref 8.6–10.2)
Chloride: 103 mmol/L (ref 96–106)
Creatinine, Ser: 1.11 mg/dL (ref 0.76–1.27)
GFR calc Af Amer: 78 mL/min/{1.73_m2} (ref >59)
GFR calc non Af Amer: 67 mL/min/{1.73_m2} (ref >59)
Globulin, Total: 2.2 g/dL (ref 1.5–4.5)
Glucose: 104 mg/dL — ABNORMAL HIGH (ref 65–99)
Potassium: 4.9 mmol/L (ref 3.5–5.2)
Sodium: 141 mmol/L (ref 134–144)
Total Protein: 6.3 g/dL (ref 6.0–8.5)

## 2020-02-26 LAB — D-DIMER, QUANTITATIVE: D-DIMER: 0.42 mg/L FEU (ref 0.00–0.49)

## 2020-02-26 LAB — POC INFLUENZA TEST: Negative: NEGATIVE

## 2020-02-26 LAB — POC COVID19 BINAXNOW: SARS Coronavirus 2 Ag: NEGATIVE

## 2020-02-26 LAB — TROPONIN T: Troponin T (Highly Sensitive): 9 ng/L (ref 0–22)

## 2020-02-27 LAB — SARS-COV-2, NAA 2 DAY TAT

## 2020-02-27 LAB — NOVEL CORONAVIRUS, NAA: SARS-CoV-2, NAA: NOT DETECTED

## 2020-02-29 ENCOUNTER — Encounter: Payer: Self-pay | Admitting: Family Medicine

## 2020-02-29 ENCOUNTER — Other Ambulatory Visit: Payer: Self-pay

## 2020-02-29 ENCOUNTER — Other Ambulatory Visit: Payer: Medicare Other

## 2020-02-29 ENCOUNTER — Ambulatory Visit (INDEPENDENT_AMBULATORY_CARE_PROVIDER_SITE_OTHER): Payer: Medicare Other | Admitting: Family Medicine

## 2020-02-29 VITALS — BP 136/100 | HR 104 | Temp 97.5°F | Resp 16 | Ht 72.0 in | Wt 234.0 lb

## 2020-02-29 DIAGNOSIS — M542 Cervicalgia: Secondary | ICD-10-CM | POA: Diagnosis not present

## 2020-02-29 DIAGNOSIS — Z23 Encounter for immunization: Secondary | ICD-10-CM | POA: Diagnosis not present

## 2020-02-29 MED ORDER — TIZANIDINE HCL 2 MG PO TABS: 2.0000 mg | ORAL_TABLET | Freq: Every day | ORAL | 0 refills | Status: DC

## 2020-02-29 MED ORDER — MELOXICAM 15 MG PO TABS: 15.0000 mg | ORAL_TABLET | Freq: Every day | ORAL | 0 refills | Status: DC

## 2020-02-29 NOTE — Progress Notes (Signed)
Acute Office Visit  Subjective:    Patient ID: Joel Harding, male    DOB: February 03, 1951, 69 y.o.   MRN: [id number]  Chief Complaint  Patient presents with  . Neck Pain    HPI Patient is in today for neck pain. Patient was sick on Thursday with fever, sweats, sob, cough. Negative covid 19 and influenza A and B. All symptoms have resolved and pt is now just having neck pain. A hot shower helps. Has not tried nsaids specifically for his neck. Tried flexeril without improvement in neck pain..  Past Medical History:  Diagnosis Date  . Hypertension, essential 06/16/2008  . Impaired fasting glucose 06/26/2010  . Mixed hyperlipidemia 08/03/2008  . Right nephrolithiasis     Past Surgical History:  Procedure Laterality Date  . CYSTOSCOPY W/ URETERAL STENT PLACEMENT Right 10/28/2013  . EYE SURGERY Left 11/2016   Cataract  . EYE SURGERY Right 10/2016   Cataract  . KNEE ARTHROSCOPY Left   . LITHOTRIPSY      Family History  Problem Relation Age of Onset  . Cancer Mother   . Heart attack Father     Social History   Socioeconomic History  . Marital status: Married    Spouse name: Not on file  . Number of children: Not on file  . Years of education: Not on file  . Highest education level: Not on file  Occupational History  . Occupation: Truck [employer] doctor  Tobacco Use  . Smoking status: Former Smoker    Types: Cigarettes    Quit date: 06/05/1979    Years since quitting: 40.7  . Smokeless tobacco: Never Used  Vaping Use  . Vaping Use: Never used  Substance and Sexual Activity  . Alcohol use: Never  . Drug use: Never  . Sexual activity: Not on file  Other Topics Concern  . Not on file  Social History Narrative  . Not on file   Social Determinants of Health   Financial Resource Strain:   . Difficulty of Paying Living Expenses: Not on file  Food Insecurity:   . Worried About Programme researcher, broadcasting/film/video in the Last Year: Not on file  . Ran Out of Food in the Last Year: Not on file    Transportation Needs: No Transportation Needs  . Lack of Transportation (Medical): No  . Lack of Transportation (Non-Medical): No  Physical Activity:   . Days of Exercise per Week: Not on file  . Minutes of Exercise per Session: Not on file  Stress:   . Feeling of Stress : Not on file  Social Connections:   . Frequency of Communication with Friends and Family: Not on file  . Frequency of Social Gatherings with Friends and Family: Not on file  . Attends Religious Services: Not on file  . Active Member of Clubs or Organizations: Not on file  . Attends Banker Meetings: Not on file  . Marital Status: Not on file  Intimate Partner Violence: Not At Risk  . Fear of Current or Ex-Partner: No  . Emotionally Abused: No  . Physically Abused: No  . Sexually Abused: No    Outpatient Medications Prior to Visit  Medication Sig Dispense Refill  . cyclobenzaprine (FLEXERIL) 10 MG tablet Take 10 mg by mouth 3 (three) times daily.    . famciclovir (FAMVIR) 250 MG tablet Take 1 tablet (250 mg total) by mouth 2 (two) times daily. 180 tablet 1  . Omega-3 Fatty Acids (FISH OIL) 1000 MG  CAPS Take 1,000 mg by mouth 2 (two) times daily.    . simvastatin (ZOCOR) 40 MG tablet Take 1 tablet (40 mg total) by mouth daily. 90 tablet 1  . valsartan (DIOVAN) 160 MG tablet Take 1 tablet (160 mg total) by mouth daily. 90 tablet 1   No facility-administered medications prior to visit.    Allergies  Allergen Reactions  . Lisinopril Cough    Review of Systems  Constitutional: Negative for chills and fever.  HENT: Negative for congestion, ear pain and sore throat.   Respiratory: Negative for cough and shortness of breath.   Cardiovascular: Negative for chest pain.  Neurological: Negative for headaches.       Objective:    Physical Exam Vitals reviewed.  Constitutional:      Appearance: Normal appearance. He is obese.  Cardiovascular:     Rate and Rhythm: Normal rate and regular rhythm.      Pulses: Normal pulses.     Heart sounds: Normal heart sounds.  Pulmonary:     Effort: Pulmonary effort is normal.     Breath sounds: Normal breath sounds. No wheezing, rhonchi or rales.  Musculoskeletal:        General: Tenderness (cervical vertebrae and paraspinal muscles. ) present. Normal range of motion.     Comments: Negative brudzinski and kernigs.  Neurological:     Mental Status: He is alert.  Psychiatric:        Mood and Affect: Mood normal.        Behavior: Behavior normal.     BP (!) 136/100   Pulse (!) 104   Temp (!) 97.5 F (36.4 C)   Resp 16   Ht 6' (1.829 m)   Wt 234 lb (106.1 kg)   BMI 31.74 kg/m  Wt Readings from Last 3 Encounters:  02/29/20 234 lb (106.1 kg)  11/02/19 237 lb (107.5 kg)  09/25/19 230 lb (104.3 kg)    Health Maintenance Due  Topic Date Due  . Hepatitis C Screening  Never done    There are no preventive care reminders to display for this patient.   No results found for: TSH Lab Results  Component Value Date   WBC 10.0 02/26/2020   HGB 15.5 02/26/2020   HCT 45.6 02/26/2020   MCV 93 02/26/2020   PLT 179 02/26/2020   Lab Results  Component Value Date   NA 141 02/26/2020   K 4.9 02/26/2020   CO2 24 02/26/2020   GLUCOSE 104 (H) 02/26/2020   BUN 16 02/26/2020   CREATININE 1.11 02/26/2020   BILITOT 0.8 02/26/2020   ALKPHOS 105 02/26/2020   AST 17 02/26/2020   ALT 12 02/26/2020   PROT 6.3 02/26/2020   ALBUMIN 4.1 02/26/2020   CALCIUM 9.2 02/26/2020   Lab Results  Component Value Date   CHOL 139 11/02/2019   Lab Results  Component Value Date   HDL 45 11/02/2019   Lab Results  Component Value Date   LDLCALC 74 11/02/2019   Lab Results  Component Value Date   TRIG 108 11/02/2019   Lab Results  Component Value Date   CHOLHDL 3.1 11/02/2019   Lab Results  Component Value Date   HGBA1C 5.8 (H) 11/02/2019       Assessment & Plan:  1. Neck pain Meloxicam 15 mg once daily.  Tizanidine 2 mg one at night.  Do not take while driving. May use tylenol as needed.  Heating pad or ice.   2. Need for immunization  against influenza  - Flu Vaccine QUAD High Dose(Fluad)    Meds ordered this encounter  Medications  . meloxicam (MOBIC) 15 MG tablet    Sig: Take 1 tablet (15 mg total) by mouth daily.    Dispense:  30 tablet    Refill:  0  . tiZANidine (ZANAFLEX) 2 MG tablet    Sig: Take 1 tablet (2 mg total) by mouth at bedtime.    Dispense:  30 tablet    Refill:  0    Orders Placed This Encounter  Procedures  . Flu Vaccine QUAD High Dose(Fluad)     Follow-up: Return if symptoms worsen or fail to improve.  An After Visit Summary was printed and given to the patient.  Blane Ohara [facility] [phone]

## 2020-02-29 NOTE — Patient Instructions (Addendum)
Meloxicam 15 mg once daily.  Tizanidine 2 mg one at night. Do not take while driving. May use tylenol as needed.  Heating pad or ice.   Neck Exercises Ask your health care provider which exercises are safe for you. Do exercises exactly as told by your health care provider and adjust them as directed. It is normal to feel mild stretching, pulling, tightness, or discomfort as you do these exercises. Stop right away if you feel sudden pain or your pain gets worse. Do not begin these exercises until told by your health care provider. Neck exercises can be important for many reasons. They can improve strength and maintain flexibility in your neck, which will help your upper back and prevent neck pain. Stretching exercises Rotation neck stretching  1. Sit in a chair or stand up. 2. Place your feet flat on the floor, shoulder width apart. 3. Slowly turn your head (rotate) to the right until a slight stretch is felt. Turn it all the way to the right so you can look over your right shoulder. Do not tilt or tip your head. 4. Hold this position for 10-30 seconds. 5. Slowly turn your head (rotate) to the left until a slight stretch is felt. Turn it all the way to the left so you can look over your left shoulder. Do not tilt or tip your head. 6. Hold this position for 10-30 seconds. Repeat __________ times. Complete this exercise __________ times a day. Neck retraction 1. Sit in a sturdy chair or stand up. 2. Look straight ahead. Do not bend your neck. 3. Use your fingers to push your chin backward (retraction). Do not bend your neck for this movement. Continue to face straight ahead. If you are doing the exercise properly, you will feel a slight sensation in your throat and a stretch at the back of your neck. 4. Hold the stretch for 1-2 seconds. Repeat __________ times. Complete this exercise __________ times a day. Strengthening exercises Neck press 1. Lie on your back on a firm bed or on the floor  with a pillow under your head. 2. Use your neck muscles to push your head down on the pillow and straighten your spine. 3. Hold the position as well as you can. Keep your head facing up (in a neutral position) and your chin tucked. 4. Slowly count to 5 while holding this position. Repeat __________ times. Complete this exercise __________ times a day. Isometrics These are exercises in which you strengthen the muscles in your neck while keeping your neck still (isometrics). 1. Sit in a supportive chair and place your hand on your forehead. 2. Keep your head and face facing straight ahead. Do not flex or extend your neck while doing isometrics. 3. Push forward with your head and neck while pushing back with your hand. Hold for 10 seconds. 4. Do the sequence again, this time putting your hand against the back of your head. Use your head and neck to push backward against the hand pressure. 5. Finally, do the same exercise on either side of your head, pushing sideways against the pressure of your hand. Repeat __________ times. Complete this exercise __________ times a day. Prone head lifts 1. Lie face-down (prone position), resting on your elbows so that your chest and upper back are raised. 2. Start with your head facing downward, near your chest. Position your chin either on or near your chest. 3. Slowly lift your head upward. Lift until you are looking straight ahead. Then continue  lifting your head as far back as you can comfortably stretch. 4. Hold your head up for 5 seconds. Then slowly lower it to your starting position. Repeat __________ times. Complete this exercise __________ times a day. Supine head lifts 1. Lie on your back (supine position), bending your knees to point to the ceiling and keeping your feet flat on the floor. 2. Lift your head slowly off the floor, raising your chin toward your chest. 3. Hold for 5 seconds. Repeat __________ times. Complete this exercise __________ times  a day. Scapular retraction 1. Stand with your arms at your sides. Look straight ahead. 2. Slowly pull both shoulders (scapulae) backward and downward (retraction) until you feel a stretch between your shoulder blades in your upper back. 3. Hold for 10-30 seconds. 4. Relax and repeat. Repeat __________ times. Complete this exercise __________ times a day. Contact a health care provider if:  Your neck pain or discomfort gets much worse when you do an exercise.  Your neck pain or discomfort does not improve within 2 hours after you exercise. If you have any of these problems, stop exercising right away. Do not do the exercises again unless your health care provider says that you can. Get help right away if:  You develop sudden, severe neck pain. If this happens, stop exercising right away. Do not do the exercises again unless your health care provider says that you can. This information is not intended to replace advice given to you by your health care provider. Make sure you discuss any questions you have with your health care provider. Document Revised: 03/12/2018 Document Reviewed: 03/12/2018 Elsevier Patient Education  2020 [employer].

## 2020-03-05 DIAGNOSIS — R519 Headache, unspecified: Secondary | ICD-10-CM | POA: Diagnosis not present

## 2020-03-05 DIAGNOSIS — R7982 Elevated C-reactive protein (CRP): Secondary | ICD-10-CM | POA: Diagnosis not present

## 2020-03-05 DIAGNOSIS — M79643 Pain in unspecified hand: Secondary | ICD-10-CM | POA: Diagnosis not present

## 2020-04-09 DIAGNOSIS — Z23 Encounter for immunization: Secondary | ICD-10-CM | POA: Diagnosis not present

## 2020-04-29 ENCOUNTER — Other Ambulatory Visit: Payer: Self-pay | Admitting: Family Medicine

## 2020-05-03 ENCOUNTER — Ambulatory Visit: Payer: Medicare Other | Admitting: Family Medicine

## 2020-05-07 ENCOUNTER — Other Ambulatory Visit: Payer: Self-pay | Admitting: Family Medicine

## 2020-05-19 ENCOUNTER — Ambulatory Visit (INDEPENDENT_AMBULATORY_CARE_PROVIDER_SITE_OTHER): Payer: Medicare Other | Admitting: Family Medicine

## 2020-05-19 ENCOUNTER — Encounter: Payer: Self-pay | Admitting: Family Medicine

## 2020-05-19 ENCOUNTER — Other Ambulatory Visit: Payer: Self-pay

## 2020-05-19 VITALS — BP 134/88 | HR 67 | Temp 97.6°F | Ht 72.0 in | Wt 236.6 lb

## 2020-05-19 DIAGNOSIS — I10 Essential (primary) hypertension: Secondary | ICD-10-CM | POA: Diagnosis not present

## 2020-05-19 DIAGNOSIS — E6609 Other obesity due to excess calories: Secondary | ICD-10-CM

## 2020-05-19 DIAGNOSIS — Z6832 Body mass index (BMI) 32.0-32.9, adult: Secondary | ICD-10-CM

## 2020-05-19 DIAGNOSIS — A6000 Herpesviral infection of urogenital system, unspecified: Secondary | ICD-10-CM | POA: Diagnosis not present

## 2020-05-19 DIAGNOSIS — E782 Mixed hyperlipidemia: Secondary | ICD-10-CM

## 2020-05-19 DIAGNOSIS — R7301 Impaired fasting glucose: Secondary | ICD-10-CM

## 2020-05-19 MED ORDER — FAMCICLOVIR 250 MG PO TABS: 250.0000 mg | ORAL_TABLET | Freq: Two times a day (BID) | ORAL | 1 refills | Status: DC

## 2020-05-19 MED ORDER — VALSARTAN 160 MG PO TABS: 160.0000 mg | ORAL_TABLET | Freq: Every day | ORAL | 1 refills | Status: DC

## 2020-05-19 MED ORDER — SIMVASTATIN 40 MG PO TABS: 40.0000 mg | ORAL_TABLET | Freq: Every day | ORAL | 1 refills | Status: DC

## 2020-05-19 NOTE — Progress Notes (Signed)
Subjective:  Patient ID: Joel Harding, male    DOB: [birth year]  Age: 69 y.o. MRN: [id number]  Chief Complaint  Patient presents with  . Hypertension    Follow up   HPI Patient presents for follow-up of hypertension, prediabetes, and hyperlipidemia.  Patient tries to eat healthy and is active in his job.  Patient denies chest pain or trouble breathing.  Is currently on simvastatin for his cholesterol as well as over-the-counter fish oil.  He currently takes valsartan 160 mg once daily for his high blood pressure.  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  Is been over 10 years since he has had an outbreak.  Patient still works full-time driving trucks.  He does report that over the last 2 days the had to euthanize their dog who got very sick after chewing on some chewing gum apparently the xylitol is poisonous to dogs.  Pt got [place] covid vaccination. He does not have his card.   Current Outpatient Medications on File Prior to Visit  Medication Sig Dispense Refill  . cyclobenzaprine (FLEXERIL) 10 MG tablet TAKE ONE TABLET BY MOUTH 3 TIMES DAILY 30 tablet 2  . famciclovir (FAMVIR) 250 MG tablet Take 1 tablet (250 mg total) by mouth 2 (two) times daily. 180 tablet 1  . Omega-3 Fatty Acids (FISH OIL) 1000 MG CAPS Take 1,000 mg by mouth 2 (two) times daily.    . simvastatin (ZOCOR) 40 MG tablet Take 1 tablet (40 mg total) by mouth daily. 90 tablet 1  . valsartan (DIOVAN) 160 MG tablet Take 1 tablet (160 mg total) by mouth daily. 90 tablet 1   No current facility-administered medications on file prior to visit.   Past Medical History:  Diagnosis Date  . Hypertension, essential 06/16/2008  . Impaired fasting glucose 06/26/2010  . Mixed hyperlipidemia 08/03/2008  . Right nephrolithiasis    Past Surgical History:  Procedure Laterality Date  . CYSTOSCOPY W/ URETERAL STENT PLACEMENT Right 10/28/2013  . EYE SURGERY Left 11/2016   Cataract  . EYE  SURGERY Right 10/2016   Cataract  . KNEE ARTHROSCOPY Left   . LITHOTRIPSY      Family History  Problem Relation Age of Onset  . Cancer Mother   . Heart attack Father    Social History   Socioeconomic History  . Marital status: Married    Spouse name: Not on file  . Number of children: Not on file  . Years of education: Not on file  . Highest education level: Not on file  Occupational History  . Occupation: Truck [employer] doctor  Tobacco Use  . Smoking status: Former Smoker    Types: Cigarettes    Quit date: 06/05/1979    Years since quitting: 40.9  . Smokeless tobacco: Never Used  Vaping Use  . Vaping Use: Never used  Substance and Sexual Activity  . Alcohol use: Never  . Drug use: Never  . Sexual activity: Not on file  Other Topics Concern  . Not on file  Social History Narrative  . Not on file   Social Determinants of Health   Financial Resource Strain: Not on file  Food Insecurity: Not on file  Transportation Needs: No Transportation Needs  . Lack of Transportation (Medical): No  . Lack of Transportation (Non-Medical): No  Physical Activity: Not on file  Stress: Not on file  Social Connections: Not on file    Review of Systems  Constitutional: Negative for  fatigue.  HENT: Negative for congestion, ear pain and sore throat.   Respiratory: Negative for cough and shortness of breath.   Cardiovascular: Negative for chest pain.  Gastrointestinal: Negative for abdominal pain, constipation, diarrhea, nausea and vomiting.  Genitourinary: Negative for dysuria, frequency and urgency.  Musculoskeletal: Positive for back pain (flexeril helps. uses occasionally. ). Negative for arthralgias and myalgias.  Neurological: Negative for dizziness and headaches.  Psychiatric/Behavioral: Negative for agitation and sleep disturbance. The patient is not nervous/anxious.      Objective:  BP 134/88 (BP Location: Right Arm, Patient Position: Sitting, Cuff Size: Large)   Pulse 67   Temp  97.6 F (36.4 C) (Temporal)   Ht 6' (1.829 m)   Wt 236 lb 9.6 oz (107.3 kg)   SpO2 97%   BMI 32.09 kg/m   BP/Weight 05/19/2020 02/29/2020 11/02/2019  Systolic BP 134 136 110  Diastolic BP 88 100 82  Wt. (Lbs) 236.6 234 237  BMI 32.09 31.74 32.14    Physical Exam Vitals reviewed.  Constitutional:      Appearance: Normal appearance.  Cardiovascular:     Rate and Rhythm: Normal rate and regular rhythm.  Pulmonary:     Effort: Pulmonary effort is normal.     Breath sounds: Normal breath sounds.  Abdominal:     General: Bowel sounds are normal.  Musculoskeletal:        General: Normal range of motion.     Cervical back: Normal range of motion.  Skin:    General: Skin is warm.  Neurological:     Mental Status: He is alert.  Psychiatric:        Mood and Affect: Mood normal.        Behavior: Behavior normal.       Lab Results  Component Value Date   WBC 10.0 02/26/2020   HGB 15.5 02/26/2020   HCT 45.6 02/26/2020   PLT 179 02/26/2020   GLUCOSE 104 (H) 02/26/2020   CHOL 139 11/02/2019   TRIG 108 11/02/2019   HDL 45 11/02/2019   LDLCALC 74 11/02/2019   ALT 12 02/26/2020   AST 17 02/26/2020   NA 141 02/26/2020   K 4.9 02/26/2020   CL 103 02/26/2020   CREATININE 1.11 02/26/2020   BUN 16 02/26/2020   CO2 24 02/26/2020   HGBA1C 5.8 (H) 11/02/2019      Assessment & Plan:   1. Hypertension, essential Hypertension is well controlled.  Continue current medications. Continue low-salt diet and and encouraged exercise - CBC with Differential/Platelet - Comprehensive metabolic panel  2. Impaired fasting glucose Recommend continue to work on eating healthy diet low sugarand exercise. - Hemoglobin A1c  3. Mixed hyperlipidemia Well controlled.  No changes to medicines.  Continue to work on eating a healthy diet and exercise.  Labs drawn today.  - Lipid panel   4 Genital herpes prevention: continue famvir.  5. Obesity with BMI 32 Recommend continue to work on  eating healthy diet and exercise.   Orders Placed This Encounter  Procedures  . CBC with Differential/Platelet  . Comprehensive metabolic panel  . Hemoglobin A1c  . Lipid panel     Follow-up: Return in about 6 months (around 11/17/2020) for awv. fasting.  An After Visit Summary was printed and given to the patient.  Blane Ohara, MD [facility] [phone]

## 2020-05-20 LAB — COMPREHENSIVE METABOLIC PANEL
ALT: 30 IU/L (ref 0–44)
AST: 29 IU/L (ref 0–40)
Albumin/Globulin Ratio: 2.2 (ref 1.2–2.2)
Albumin: 4.6 g/dL (ref 3.8–4.8)
Alkaline Phosphatase: 127 IU/L — ABNORMAL HIGH (ref 44–121)
BUN/Creatinine Ratio: 17 (ref 10–24)
BUN: 22 mg/dL (ref 8–27)
Bilirubin Total: 0.5 mg/dL (ref 0.0–1.2)
CO2: 23 mmol/L (ref 20–29)
Calcium: 9.5 mg/dL (ref 8.6–10.2)
Chloride: 101 mmol/L (ref 96–106)
Creatinine, Ser: 1.28 mg/dL — ABNORMAL HIGH (ref 0.76–1.27)
GFR calc Af Amer: 66 mL/min/{1.73_m2} (ref >59)
GFR calc non Af Amer: 57 mL/min/{1.73_m2} — ABNORMAL LOW (ref >59)
Globulin, Total: 2.1 g/dL (ref 1.5–4.5)
Glucose: 110 mg/dL — ABNORMAL HIGH (ref 65–99)
Potassium: 5.1 mmol/L (ref 3.5–5.2)
Sodium: 141 mmol/L (ref 134–144)
Total Protein: 6.7 g/dL (ref 6.0–8.5)

## 2020-05-20 LAB — LIPID PANEL
Chol/HDL Ratio: 3.8 ratio (ref 0.0–5.0)
Cholesterol, Total: 175 mg/dL (ref 100–199)
HDL: 46 mg/dL (ref >39)
LDL Chol Calc (NIH): 97 mg/dL (ref 0–99)
Triglycerides: 188 mg/dL — ABNORMAL HIGH (ref 0–149)
VLDL Cholesterol Cal: 32 mg/dL (ref 5–40)

## 2020-05-20 LAB — CBC WITH DIFFERENTIAL/PLATELET
Basophils Absolute: 0 10*3/uL (ref 0.0–0.2)
Basos: 1 %
EOS (ABSOLUTE): 0.2 10*3/uL (ref 0.0–0.4)
Eos: 3 %
Hematocrit: 49.9 % (ref 37.5–51.0)
Hemoglobin: 16.8 g/dL (ref 13.0–17.7)
Immature Grans (Abs): 0 10*3/uL (ref 0.0–0.1)
Immature Granulocytes: 0 %
Lymphocytes Absolute: 2.1 10*3/uL (ref 0.7–3.1)
Lymphs: 29 %
MCH: 30.8 pg (ref 26.6–33.0)
MCHC: 33.7 g/dL (ref 31.5–35.7)
MCV: 91 fL (ref 79–97)
Monocytes Absolute: 0.7 10*3/uL (ref 0.1–0.9)
Monocytes: 9 %
Neutrophils Absolute: 4.2 10*3/uL (ref 1.4–7.0)
Neutrophils: 58 %
Platelets: 187 10*3/uL (ref 150–450)
RBC: 5.46 x10E6/uL (ref 4.14–5.80)
RDW: 13.5 % (ref 11.6–15.4)
WBC: 7.1 10*3/uL (ref 3.4–10.8)

## 2020-05-20 LAB — CARDIOVASCULAR RISK ASSESSMENT

## 2020-05-20 LAB — HEMOGLOBIN A1C
Est. average glucose Bld gHb Est-mCnc: 128 mg/dL
Hgb A1c MFr Bld: 6.1 % — ABNORMAL HIGH (ref 4.8–5.6)

## 2020-06-05 DIAGNOSIS — R509 Fever, unspecified: Secondary | ICD-10-CM | POA: Diagnosis not present

## 2020-06-05 DIAGNOSIS — J069 Acute upper respiratory infection, unspecified: Secondary | ICD-10-CM | POA: Diagnosis not present

## 2020-06-05 DIAGNOSIS — R051 Acute cough: Secondary | ICD-10-CM | POA: Diagnosis not present

## 2020-06-05 DIAGNOSIS — Z20828 Contact with and (suspected) exposure to other viral communicable diseases: Secondary | ICD-10-CM | POA: Diagnosis not present

## 2020-06-15 DIAGNOSIS — J209 Acute bronchitis, unspecified: Secondary | ICD-10-CM | POA: Diagnosis not present

## 2020-06-15 DIAGNOSIS — R051 Acute cough: Secondary | ICD-10-CM | POA: Diagnosis not present

## 2020-06-23 DIAGNOSIS — R519 Headache, unspecified: Secondary | ICD-10-CM | POA: Diagnosis not present

## 2020-06-23 DIAGNOSIS — J209 Acute bronchitis, unspecified: Secondary | ICD-10-CM | POA: Diagnosis not present

## 2020-06-23 DIAGNOSIS — J0100 Acute maxillary sinusitis, unspecified: Secondary | ICD-10-CM | POA: Diagnosis not present

## 2020-06-23 DIAGNOSIS — R0981 Nasal congestion: Secondary | ICD-10-CM | POA: Diagnosis not present

## 2020-07-04 ENCOUNTER — Other Ambulatory Visit: Payer: Self-pay

## 2020-07-04 MED ORDER — SIMVASTATIN 40 MG PO TABS: 40.0000 mg | ORAL_TABLET | Freq: Every day | ORAL | 1 refills | Status: DC

## 2020-07-08 ENCOUNTER — Encounter: Payer: Self-pay | Admitting: Family Medicine

## 2020-07-18 ENCOUNTER — Other Ambulatory Visit: Payer: Self-pay

## 2020-07-18 MED ORDER — FAMCICLOVIR 250 MG PO TABS: 250.0000 mg | ORAL_TABLET | Freq: Two times a day (BID) | ORAL | 1 refills | Status: DC

## 2020-09-22 DIAGNOSIS — Z743 Need for continuous supervision: Secondary | ICD-10-CM | POA: Diagnosis not present

## 2020-09-22 DIAGNOSIS — K2970 Gastritis, unspecified, without bleeding: Secondary | ICD-10-CM | POA: Diagnosis not present

## 2020-09-22 DIAGNOSIS — K5732 Diverticulitis of large intestine without perforation or abscess without bleeding: Secondary | ICD-10-CM | POA: Diagnosis not present

## 2020-09-22 DIAGNOSIS — R944 Abnormal results of kidney function studies: Secondary | ICD-10-CM | POA: Diagnosis not present

## 2020-09-22 DIAGNOSIS — E785 Hyperlipidemia, unspecified: Secondary | ICD-10-CM | POA: Diagnosis not present

## 2020-09-22 DIAGNOSIS — Z8249 Family history of ischemic heart disease and other diseases of the circulatory system: Secondary | ICD-10-CM | POA: Diagnosis not present

## 2020-09-22 DIAGNOSIS — Z87891 Personal history of nicotine dependence: Secondary | ICD-10-CM | POA: Diagnosis not present

## 2020-09-22 DIAGNOSIS — R9431 Abnormal electrocardiogram [ECG] [EKG]: Secondary | ICD-10-CM | POA: Diagnosis not present

## 2020-09-22 DIAGNOSIS — N200 Calculus of kidney: Secondary | ICD-10-CM | POA: Diagnosis not present

## 2020-09-22 DIAGNOSIS — K5730 Diverticulosis of large intestine without perforation or abscess without bleeding: Secondary | ICD-10-CM | POA: Diagnosis not present

## 2020-09-22 DIAGNOSIS — R109 Unspecified abdominal pain: Secondary | ICD-10-CM | POA: Diagnosis not present

## 2020-09-22 DIAGNOSIS — H9190 Unspecified hearing loss, unspecified ear: Secondary | ICD-10-CM | POA: Diagnosis not present

## 2020-09-22 DIAGNOSIS — K5792 Diverticulitis of intestine, part unspecified, without perforation or abscess without bleeding: Secondary | ICD-10-CM | POA: Diagnosis not present

## 2020-09-22 DIAGNOSIS — R1110 Vomiting, unspecified: Secondary | ICD-10-CM | POA: Diagnosis not present

## 2020-09-22 DIAGNOSIS — I10 Essential (primary) hypertension: Secondary | ICD-10-CM | POA: Diagnosis not present

## 2020-09-22 DIAGNOSIS — I4510 Unspecified right bundle-branch block: Secondary | ICD-10-CM | POA: Diagnosis not present

## 2020-09-23 DIAGNOSIS — K5790 Diverticulosis of intestine, part unspecified, without perforation or abscess without bleeding: Secondary | ICD-10-CM | POA: Diagnosis not present

## 2020-09-23 DIAGNOSIS — I4510 Unspecified right bundle-branch block: Secondary | ICD-10-CM | POA: Diagnosis not present

## 2020-09-23 DIAGNOSIS — I10 Essential (primary) hypertension: Secondary | ICD-10-CM | POA: Diagnosis not present

## 2020-09-23 DIAGNOSIS — R9431 Abnormal electrocardiogram [ECG] [EKG]: Secondary | ICD-10-CM | POA: Diagnosis not present

## 2020-09-25 DIAGNOSIS — R1012 Left upper quadrant pain: Secondary | ICD-10-CM | POA: Diagnosis not present

## 2020-10-20 ENCOUNTER — Telehealth: Payer: Self-pay | Admitting: Family Medicine

## 2020-10-20 NOTE — Progress Notes (Signed)
  Chronic Care Management   Outreach Note  10/20/2020 Name: GAREY ALLEVA MRN: [id number] DOB: 04/15/1951  Referred by: Blane Ohara, MD Reason for referral : No chief complaint on file.   An unsuccessful telephone outreach was attempted today. The patient was referred to the pharmacist for assistance with care management and care coordination.   Follow Up Plan:   Tatjana Dellinger [employer] scheduler

## 2020-10-20 NOTE — Progress Notes (Signed)
  Chronic Care Management   Note  10/20/2020 Name: Joel Harding MRN: [id number] DOB: 06/02/1950  Joel Harding is a 70 y.o. year old male who is a primary care patient of Cox, Kirsten, MD. I reached out to Joel Harding by phone today in response to a referral sent by Joel Harding's PCP, Cox, Kirsten, MD.   Joel Harding was given information about Chronic Care Management services today including:  1. CCM service includes personalized support from designated clinical staff supervised by his physician, including individualized plan of care and coordination with other care providers 2. 24/7 contact phone numbers for assistance for urgent and routine care needs. 3. Service will only be billed when office clinical staff spend 20 minutes or more in a month to coordinate care. 4. Only one practitioner may furnish and bill the service in a calendar month. 5. The patient may stop CCM services at any time (effective at the end of the month) by phone call to the office staff.   Patient wishes to consider information provided and/or speak with a member of the care team before deciding about enrollment in care management services.   Follow up plan:   Tatjana Restaurant manager, [employer] food

## 2020-11-20 NOTE — Progress Notes (Signed)
Subjective:  Patient ID: Joel Harding, male    DOB: 1950/06/21  Age: 70 y.o. MRN: [id number]  Chief Complaint  Patient presents with   Annual Exam    HPI Encounter for general adult medical examination without abnormal findings  Physical ("At Risk" items are starred): Patient's last physical exam was 1 year ago .   Patient has hypertension, hyperlipidemia, and prediabetes. On zocor, fish oil. On valsartan 160 mg once daily.   Pt has low back pain. Worsened. Has flexeril  Growth percentile SmartLinks can only be used for patients less than 62 years old.   SDOH Screenings   Alcohol Screen: Low Risk    Last Alcohol Screening Score (AUDIT): 0  Depression (PHQ2-9): Low Risk    PHQ-2 Score: 0  Financial Resource Strain: Not on file  Food Insecurity: Not on file  Housing: Not on file  Physical Activity: Inactive   Days of Exercise per Week: 0 days   Minutes of Exercise per Session: 0 min  Social Connections: Not on file  Stress: Not on file  Tobacco Use: Medium Risk   Smoking Tobacco Use: Former   Smokeless Tobacco Use: Never  Transportation Needs: Not on file    Fall Risk  11/21/2020 11/02/2019 09/25/2019 12/17/2016  Falls in the past year? 0 0 0 No  Comment - - - Emmi Telephone Survey: data to providers prior to load  Number falls in past yr: 0 0 - -  Injury with Fall? 0 - - -  Risk for fall due to : No Fall Risks - - -  Follow up Falls evaluation completed - - -    Depression screen [facility] 2/9 11/21/2020 11/02/2019 09/25/2019  Decreased Interest 0 0 0  Down, Depressed, Hopeless 0 0 0  PHQ - 2 Score 0 0 0    Functional Status Survey: Is the patient deaf or have difficulty hearing?: Yes Does the patient have difficulty seeing, even when wearing glasses/contacts?: No Does the patient have difficulty concentrating, remembering, or making decisions?: No Does the patient have difficulty walking or climbing stairs?: No Does the patient have difficulty dressing or bathing?: No Does  the patient have difficulty doing errands alone such as visiting a doctor's office or shopping?: No   Safety: reviewed ;  Patient wears a seat belt. Patient's home has smoke detectors and carbon monoxide detectors. Patient practices appropriate gun safety Patient wears sunscreen with extended sun exposure. Dental Care: biannual cleanings, brushes and flosses daily. Ophthalmology/Optometry: Annual visit.  Hearing loss: none Vision impairments: none Patient is not afflicted from Stress Incontinence and Urge Incontinence   Current Outpatient Medications on File Prior to Visit  Medication Sig Dispense Refill   cyclobenzaprine (FLEXERIL) 10 MG tablet TAKE ONE TABLET BY MOUTH 3 TIMES DAILY 30 tablet 2   famciclovir (FAMVIR) 250 MG tablet Take 1 tablet (250 mg total) by mouth 2 (two) times daily. 180 tablet 1   Omega-3 Fatty Acids (FISH OIL) 1000 MG CAPS Take 1,000 mg by mouth 2 (two) times daily.     simvastatin (ZOCOR) 40 MG tablet Take 1 tablet (40 mg total) by mouth daily. 90 tablet 1   valsartan (DIOVAN) 160 MG tablet Take 1 tablet (160 mg total) by mouth daily. 90 tablet 1   No current facility-administered medications on file prior to visit.    Social Hx   Social History   Socioeconomic History   Marital status: Married    Spouse name: Not on file   Number  of children: Not on file   Years of education: Not on file   Highest education level: Not on file  Occupational History   Occupation: Truck Driver  Tobacco Use   Smoking status: Former    Pack years: 0.00    Types: Cigarettes    Quit date: 06/05/1979    Years since quitting: 41.5   Smokeless tobacco: Never  Vaping Use   Vaping Use: Never used  Substance and Sexual Activity   Alcohol use: Never   Drug use: Never   Sexual activity: Not on file  Other Topics Concern   Not on file  Social History Narrative   Not on file   Social Determinants of Health   Financial Resource Strain: Not on file  Food Insecurity: Not on  file  Transportation Needs: Not on file  Physical Activity: Inactive   Days of Exercise per Week: 0 days   Minutes of Exercise per Session: 0 min  Stress: Not on file  Social Connections: Not on file   Past Medical History:  Diagnosis Date   Hypertension, essential 06/16/2008   Impaired fasting glucose 06/26/2010   Mixed hyperlipidemia 08/03/2008   Right nephrolithiasis    Family History  Problem Relation Age of Onset   Cancer Mother    Heart attack Father     Review of Systems  Constitutional:  Negative for chills, fatigue and fever.  HENT:  Negative for congestion, ear pain and sore throat.   Respiratory:  Negative for cough and shortness of breath.   Cardiovascular:  Negative for chest pain.  Gastrointestinal:  Negative for abdominal pain, constipation, diarrhea, nausea and vomiting.  Endocrine: Negative for polydipsia, polyphagia and polyuria.  Genitourinary:  Negative for dysuria and frequency.  Musculoskeletal:  Negative for arthralgias and myalgias.  Neurological:  Negative for dizziness and headaches.  Psychiatric/Behavioral:  Negative for dysphoric mood.        No dysphoria    Objective:  BP 114/72   Pulse 78   Temp (!) 97.2 F (36.2 C)   Ht 6' (1.829 m)   Wt 237 lb (107.5 kg)   BMI 32.14 kg/m   BP/Weight 11/21/2020 05/19/2020 02/29/2020  Systolic BP 114 134 136  Diastolic BP 72 88 100  Wt. (Lbs) 237 236.6 234  BMI 32.14 32.09 31.74    Physical Exam Vitals reviewed.  Constitutional:      Appearance: Normal appearance.  Neck:     Vascular: No carotid bruit.  Cardiovascular:     Rate and Rhythm: Normal rate and regular rhythm.     Pulses: Normal pulses.     Heart sounds: Normal heart sounds.  Pulmonary:     Effort: Pulmonary effort is normal.     Breath sounds: Normal breath sounds. No wheezing, rhonchi or rales.  Abdominal:     General: Bowel sounds are normal.     Palpations: Abdomen is soft.     Tenderness: There is no abdominal tenderness.   Neurological:     Mental Status: He is alert.  Psychiatric:        Mood and Affect: Mood normal.        Behavior: Behavior normal.    Lab Results  Component Value Date   WBC 7.1 05/19/2020   HGB 16.8 05/19/2020   HCT 49.9 05/19/2020   PLT 187 05/19/2020   GLUCOSE 110 (H) 05/19/2020   CHOL 134 11/21/2020   TRIG 83 11/21/2020   HDL 41 11/21/2020   LDLCALC 77 11/21/2020  ALT 30 05/19/2020   AST 29 05/19/2020   NA 141 05/19/2020   K 5.1 05/19/2020   CL 101 05/19/2020   CREATININE 1.28 (H) 05/19/2020   BUN 22 05/19/2020   CO2 23 05/19/2020   HGBA1C 5.9 (H) 11/21/2020   MICROALBUR 150 11/21/2020      Assessment & Plan:  1. Hypertension, essential Well controlled.  No changes to medicines.  Continue to work on eating a healthy diet and exercise.   2. Impaired fasting glucose Recommend continue to work on eating healthy diet and exercise. - Hemoglobin A1c - POCT UA - Microalbumin  3. Mixed hyperlipidemia Well controlled.  No changes to medicines.  Continue to work on eating a healthy diet and exercise.  Labs drawn today.  - Lipid panel  4. Routine medical exam Well male.  Recommended health care directives.   5. Screen for colon cancer - Ambulatory referral to Gastroenterology  6. Need for hepatitis C screening test - HCV Ab w Reflex to Quant PCR   7. Back pain with sciatica Rx: prednisone.  Meds ordered this encounter  Medications   predniSONE (DELTASONE) 10 MG tablet    Sig: Take 6 tablets (60 mg total) by mouth daily with breakfast for 1 day, THEN 5 tablets (50 mg total) daily with breakfast for 1 day, THEN 4 tablets (40 mg total) daily with breakfast for 1 day, THEN 3 tablets (30 mg total) daily with breakfast for 1 day, THEN 2 tablets (20 mg total) daily with breakfast for 1 day, THEN 1 tablet (10 mg total) daily with breakfast for 1 day.    Dispense:  21 tablet    Refill:  0     These are the goals we discussed:  Goals      Advanced Care  Planning complete advance directive     Information given, he will complete and bring a copy for our file      Client understands the importance of follow-up with providers by attending scheduled visits         This is a list of the screening recommended for you and due dates:  Health Maintenance  Topic Date Due   Zoster (Shingles) Vaccine (1 of 2) Never done   Flu Shot  12/26/2020   Colon Cancer Screening  04/09/2021   Tetanus Vaccine  11/21/2024   Hepatitis C Screening: USPSTF Recommendation to screen - Ages 18-79 yo.  Completed   Pneumonia vaccines  Completed   HPV Vaccine  Aged Out   COVID-19 Vaccine  Discontinued      AN INDIVIDUALIZED CARE PLAN: was established or reinforced today.   SELF MANAGEMENT: The patient and I together assessed ways to personally work towards obtaining the recommended goals  Support needs The patient and/or family needs were assessed and services were offered and not necessary at this time.    Follow-up: Return in about 6 months (around 05/23/2021) for fasting.  Blane Ohara, MD [facility] [phone]

## 2020-11-21 ENCOUNTER — Other Ambulatory Visit: Payer: Self-pay

## 2020-11-21 ENCOUNTER — Ambulatory Visit (INDEPENDENT_AMBULATORY_CARE_PROVIDER_SITE_OTHER): Payer: Medicare Other | Admitting: Family Medicine

## 2020-11-21 ENCOUNTER — Encounter: Payer: Self-pay | Admitting: Family Medicine

## 2020-11-21 VITALS — BP 114/72 | HR 78 | Temp 97.2°F | Ht 72.0 in | Wt 237.0 lb

## 2020-11-21 DIAGNOSIS — Z0000 Encounter for general adult medical examination without abnormal findings: Secondary | ICD-10-CM

## 2020-11-21 DIAGNOSIS — E782 Mixed hyperlipidemia: Secondary | ICD-10-CM | POA: Diagnosis not present

## 2020-11-21 DIAGNOSIS — I10 Essential (primary) hypertension: Secondary | ICD-10-CM

## 2020-11-21 DIAGNOSIS — R7301 Impaired fasting glucose: Secondary | ICD-10-CM | POA: Diagnosis not present

## 2020-11-21 DIAGNOSIS — Z1159 Encounter for screening for other viral diseases: Secondary | ICD-10-CM

## 2020-11-21 DIAGNOSIS — M5440 Lumbago with sciatica, unspecified side: Secondary | ICD-10-CM | POA: Diagnosis not present

## 2020-11-21 DIAGNOSIS — Z1211 Encounter for screening for malignant neoplasm of colon: Secondary | ICD-10-CM | POA: Diagnosis not present

## 2020-11-21 LAB — POCT UA - MICROALBUMIN: Microalbumin Ur, POC: 150 mg/L

## 2020-11-21 MED ORDER — PREDNISONE 10 MG PO TABS: ORAL_TABLET | ORAL | 0 refills | Status: AC

## 2020-11-21 NOTE — Patient Instructions (Addendum)
Prednisone taper given for left back numbness.  Recommend get Shingrix vaccines at pharmacy.   Preventive Care 27 Years and Older, Male Preventive care refers to lifestyle choices and visits with your health care provider that can promote health and wellness. This includes: A yearly physical exam. This is also called an annual wellness visit. Regular dental and eye exams. Immunizations. Screening for certain conditions. Healthy lifestyle choices, such as: Eating a healthy diet. Getting regular exercise. Not using drugs or products that contain nicotine and tobacco. Limiting alcohol use. What can I expect for my preventive care visit? Physical exam Your health care provider will check your: Height and weight. These may be used to calculate your BMI (body mass index). BMI is a measurement that tells if you are at a healthy weight. Heart rate and blood pressure. Body temperature. Skin for abnormal spots. Counseling Your health care provider may ask you questions about your: Past medical problems. Family's medical history. Alcohol, tobacco, and drug use. Emotional well-being. Home life and relationship well-being. Sexual activity. Diet, exercise, and sleep habits. History of falls. Memory and ability to understand (cognition). Work and work Astronomer. Access to firearms. What immunizations do I need?  Vaccines are usually given at various ages, according to a schedule. Your health care provider will recommend vaccines for you based on your age, medicalhistory, and lifestyle or other factors, such as travel or where you work. What tests do I need? Blood tests Lipid and cholesterol levels. These may be checked every 5 years, or more often depending on your overall health. Hepatitis C test. Hepatitis B test. Screening Lung cancer screening. You may have this screening every year starting at age 37 if you have a 30-pack-year history of smoking and currently smoke or have quit  within the past 15 years. Colorectal cancer screening. All adults should have this screening starting at age 98 and continuing until age 109. Your health care provider may recommend screening at age 63 if you are at increased risk. You will have tests every 1-10 years, depending on your results and the type of screening test. Prostate cancer screening. Recommendations will vary depending on your family history and other risks. Genital exam to check for testicular cancer or hernias. Diabetes screening. This is done by checking your blood sugar (glucose) after you have not eaten for a while (fasting). You may have this done every 1-3 years. Abdominal aortic aneurysm (AAA) screening. You may need this if you are a current or former smoker. STD (sexually transmitted disease) testing, if you are at risk. Follow these instructions at home: Eating and drinking  Eat a diet that includes fresh fruits and vegetables, whole grains, lean protein, and low-fat dairy products. Limit your intake of foods with high amounts of sugar, saturated fats, and salt. Take vitamin and mineral supplements as recommended by your health care provider. Do not drink alcohol if your health care provider tells you not to drink. If you drink alcohol: Limit how much you have to 0-2 drinks a day. Be aware of how much alcohol is in your drink. In the [place] one drink equals one 12 oz bottle of beer (355 mL), one 5 oz glass of wine (148 mL), or one 1 oz glass of hard liquor (44 mL).  Lifestyle Take daily care of your teeth and gums. Brush your teeth every morning and night with fluoride toothpaste. Floss one time each day. Stay active. Exercise for at least 30 minutes 5 or more days each week. Do  not use any products that contain nicotine or tobacco, such as cigarettes, e-cigarettes, and chewing tobacco. If you need help quitting, ask your health care provider. Do not use drugs. If you are sexually active, practice safe sex. Use  a condom or other form of protection to prevent STIs (sexually transmitted infections). Talk with your health care provider about taking a low-dose aspirin or statin. Find healthy ways to cope with stress, such as: Meditation, yoga, or listening to music. Journaling. Talking to a trusted person. Spending time with friends and family. Safety Always wear your seat belt while driving or riding in a vehicle. Do not drive: If you have been drinking alcohol. Do not ride with someone who has been drinking. When you are tired or distracted. While texting. Wear a helmet and other protective equipment during sports activities. If you have firearms in your house, make sure you follow all gun safety procedures. What's next? Visit your health care provider once a year for an annual wellness visit. Ask your health care provider how often you should have your eyes and teeth checked. Stay up to date on all vaccines. This information is not intended to replace advice given to you by your health care provider. Make sure you discuss any questions you have with your healthcare provider. Document Revised: 02/10/2019 Document Reviewed: 05/08/2018 Elsevier Patient Education  2022 Elsevier Inc.  Critical care medicine: Principles of diagnosis and management in the adult (4th ed., pp. 6269-4854). Saunders."> Miller's anesthesia (8th ed., pp. 232-250). Saunders.">  Advance Directive  Advance directives are legal documents that allow you to make decisions about your health care and medical treatment in case you become unable to communicate for yourself. Advance directives let your wishes be known to family, friends,and health care providers. Discussing and writing advance directives should happen over time rather than all at once. Advance directives can be changed and updated at any time. There are different types of advance directives, such as: Medical power of attorney. Living will. Do not resuscitate (DNR)  order or do not attempt resuscitation (DNAR) order. Health care proxy and medical power of attorney A health care proxy is also called a health care agent. This person is appointed to make medical decisions for you when you are unable to make decisions for yourself. Generally, people ask a trusted friend or family member to act as their proxy and represent their preferences. Make sure you have an agreement with your trusted person to act as your proxy. A proxy may have tomake a medical decision on your behalf if your wishes are not known. A medical power of attorney, also called a durable power of attorney for health care, is a legal document that names your health care proxy. Depending on the laws in your state, the document may need to be: Signed. Notarized. Dated. Copied. Witnessed. Incorporated into your medical record. You may also want to appoint a trusted person to manage your money in the event you are unable to do so. This is called a durable power of attorney for finances. It is a separate legal document from the durable power of attorney for health care. You may choose your health care proxy or someone different toact as your agent in money matters. If you do not appoint a proxy, or there is a concern that the proxy is not acting in your best interest, a court may appoint a guardian to act on yourbehalf. Living will A living will is a set of instructions that state your  wishes about medical care when you cannot express them yourself. Health care providers should keep a copy of your living will in your medical record. You may want to give a copy to family members or friends. To alert caregivers in case of an emergency, you can place a card in your wallet to let them know that you have a living will and where they can find it. A living will is used if you become: Terminally ill. Disabled. Unable to communicate or make decisions. The following decisions should be included in your living  will: To use or not to use life support equipment, such as dialysis machines and breathing machines (ventilators). Whether you want a DNR or DNAR order. This tells health care providers not to use cardiopulmonary resuscitation (CPR) if breathing or heartbeat stops. To use or not to use tube feeding. To be given or not to be given food and fluids. Whether you want comfort (palliative) care when the goal becomes comfort rather than a cure. Whether you want to donate your organs and tissues. A living will does not give instructions for distributing your money andproperty if you should pass away. DNR or DNAR A DNR or DNAR order is a request not to have CPR in the event that your heart stops beating or you stop breathing. If a DNR or DNAR order has not been made and shared, a health care provider will try to help any patient whose heart has stopped or who has stopped breathing. If you plan to have surgery, talk with your health care provider about how your DNR or DNAR order will be followed ifproblems occur. What if I do not have an advance directive? Some states assign family decision makers to act on your behalf if you do not have an advance directive. Each state has its own laws about advance directives. You may want to check with your health care provider, attorney, orstate representative about the laws in your state. Summary Advance directives are legal documents that allow you to make decisions about your health care and medical treatment in case you become unable to communicate for yourself. The process of discussing and writing advance directives should happen over time. You can change and update advance directives at any time. Advance directives may include a medical power of attorney, a living will, and a DNR or DNAR order. This information is not intended to replace advice given to you by your health care provider. Make sure you discuss any questions you have with your healthcare  provider. Document Revised: 02/16/2020 Document Reviewed: 02/16/2020 Elsevier Patient Education  2022 [employer].

## 2020-11-22 LAB — LIPID PANEL
Chol/HDL Ratio: 3.3 ratio (ref 0.0–5.0)
Cholesterol, Total: 134 mg/dL (ref 100–199)
HDL: 41 mg/dL (ref >39)
LDL Chol Calc (NIH): 77 mg/dL (ref 0–99)
Triglycerides: 83 mg/dL (ref 0–149)
VLDL Cholesterol Cal: 16 mg/dL (ref 5–40)

## 2020-11-22 LAB — HEMOGLOBIN A1C
Est. average glucose Bld gHb Est-mCnc: 123 mg/dL
Hgb A1c MFr Bld: 5.9 % — ABNORMAL HIGH (ref 4.8–5.6)

## 2020-11-22 LAB — HCV AB W REFLEX TO QUANT PCR: HCV Ab: 0.2 s/co ratio (ref 0.0–0.9)

## 2020-12-09 ENCOUNTER — Other Ambulatory Visit: Payer: Self-pay

## 2020-12-09 ENCOUNTER — Ambulatory Visit (INDEPENDENT_AMBULATORY_CARE_PROVIDER_SITE_OTHER): Payer: Medicare Other | Admitting: Family Medicine

## 2020-12-09 VITALS — BP 132/75 | HR 59 | Temp 97.6°F | Resp 14 | Ht 72.0 in | Wt 237.0 lb

## 2020-12-09 DIAGNOSIS — R1032 Left lower quadrant pain: Secondary | ICD-10-CM

## 2020-12-09 NOTE — Progress Notes (Signed)
Acute Office Visit  Subjective:    Patient ID: Joel Harding, male    DOB: 12-21-1950, 70 y.o.   MRN: [id number]  Chief Complaint  Patient presents with   Abdominal Pain     HPI Patient is in today for LLQ abdominal pain x 3 weeks. Pt was canoeing and feels this flared it up. Pt does not have constipation, diarrhea, nausea, or vomiting. No bloody or black stools The patient was seen in March in [place], [place] for similar pain and no source was found. CT scan showed no diverticular disease. Labs were normal. Denies bladder sxs. No radiation of pain to back. The pain comes and goes, but is sharp when it comes on.   Past Medical History:  Diagnosis Date   Hypertension, essential 06/16/2008   Impaired fasting glucose 06/26/2010   Mixed hyperlipidemia 08/03/2008   Right nephrolithiasis     Past Surgical History:  Procedure Laterality Date   CYSTOSCOPY W/ URETERAL STENT PLACEMENT Right 10/28/2013   EYE SURGERY Left 11/2016   Cataract   EYE SURGERY Right 10/2016   Cataract   KNEE ARTHROSCOPY Left    LITHOTRIPSY      Family History  Problem Relation Age of Onset   Cancer Mother    Heart attack Father     Social History   Socioeconomic History   Marital status: Married    Spouse name: Not on file   Number of children: Not on file   Years of education: Not on file   Highest education level: Not on file  Occupational History   Occupation: [employer] doctor  Tobacco Use   Smoking status: Former    Types: Cigarettes    Quit date: 06/05/1979    Years since quitting: 41.5   Smokeless tobacco: Never  Vaping Use   Vaping Use: Never used  Substance and Sexual Activity   Alcohol use: Never   Drug use: Never   Sexual activity: Not on file  Other Topics Concern   Not on file  Social History Narrative   Not on file   Social Determinants of Health   Financial Resource Strain: Not on file  Food Insecurity: Not on file  Transportation Needs: Not on file  Physical Activity:  Inactive   Days of Exercise per Week: 0 days   Minutes of Exercise per Session: 0 min  Stress: Not on file  Social Connections: Not on file  Intimate Partner Violence: Not on file    Outpatient Medications Prior to Visit  Medication Sig Dispense Refill   cyclobenzaprine (FLEXERIL) 10 MG tablet TAKE ONE TABLET BY MOUTH 3 TIMES DAILY 30 tablet 2   famciclovir (FAMVIR) 250 MG tablet Take 1 tablet (250 mg total) by mouth 2 (two) times daily. 180 tablet 1   Omega-3 Fatty Acids (FISH OIL) 1000 MG CAPS Take 1,000 mg by mouth 2 (two) times daily.     simvastatin (ZOCOR) 40 MG tablet Take 1 tablet (40 mg total) by mouth daily. 90 tablet 1   valsartan (DIOVAN) 160 MG tablet Take 1 tablet (160 mg total) by mouth daily. 90 tablet 1   No facility-administered medications prior to visit.    Allergies  Allergen Reactions   Lisinopril Cough    Review of Systems  Constitutional:  Negative for chills and fever.  HENT:  Negative for congestion, ear pain and sore throat.   Respiratory:  Negative for cough and shortness of breath.   Cardiovascular:  Negative for chest pain.  Gastrointestinal:  Positive for abdominal pain. Negative for blood in stool, constipation, diarrhea, nausea and vomiting.  Genitourinary:  Negative for dysuria, hematuria and testicular pain.      Objective:    Physical Exam Vitals reviewed.  Constitutional:      Appearance: Normal appearance.  Cardiovascular:     Rate and Rhythm: Normal rate and regular rhythm.     Heart sounds: Normal heart sounds.  Pulmonary:     Effort: Pulmonary effort is normal.     Breath sounds: Normal breath sounds. No wheezing, rhonchi or rales.  Abdominal:     General: Bowel sounds are normal.     Palpations: Abdomen is soft. There is no mass.     Tenderness: There is abdominal tenderness (LLQ). There is no right CVA tenderness, left CVA tenderness, guarding or rebound.     Hernia: No hernia is present.  Genitourinary:    Penis: Normal.       Testes: Normal.  Neurological:     Mental Status: He is alert and oriented to person, place, and time.  Psychiatric:        Mood and Affect: Mood normal.        Behavior: Behavior normal.    There were no vitals taken for this visit. Wt Readings from Last 3 Encounters:  11/21/20 237 lb (107.5 kg)  05/19/20 236 lb 9.6 oz (107.3 kg)  02/29/20 234 lb (106.1 kg)    Health Maintenance Due  Topic Date Due   Zoster Vaccines- Shingrix (1 of 2) Never done    There are no preventive care reminders to display for this patient.   No results found for: TSH Lab Results  Component Value Date   WBC 7.1 05/19/2020   HGB 16.8 05/19/2020   HCT 49.9 05/19/2020   MCV 91 05/19/2020   PLT 187 05/19/2020   Lab Results  Component Value Date   NA 141 05/19/2020   K 5.1 05/19/2020   CO2 23 05/19/2020   GLUCOSE 110 (H) 05/19/2020   BUN 22 05/19/2020   CREATININE 1.28 (H) 05/19/2020   BILITOT 0.5 05/19/2020   ALKPHOS 127 (H) 05/19/2020   AST 29 05/19/2020   ALT 30 05/19/2020   PROT 6.7 05/19/2020   ALBUMIN 4.6 05/19/2020   CALCIUM 9.5 05/19/2020   Lab Results  Component Value Date   CHOL 134 11/21/2020   Lab Results  Component Value Date   HDL 41 11/21/2020   Lab Results  Component Value Date   LDLCALC 77 11/21/2020   Lab Results  Component Value Date   TRIG 83 11/21/2020   Lab Results  Component Value Date   CHOLHDL 3.3 11/21/2020   Lab Results  Component Value Date   HGBA1C 5.9 (H) 11/21/2020       Assessment & Plan:  1. LLQ abdominal pain - Ambulatory referral to Gastroenterology - CBC with Differential/Platelet - Comprehensive metabolic panel  Requested records.  Use tylenol if needed.   Orders Placed This Encounter  Procedures   CBC with Differential/Platelet   Comprehensive metabolic panel   Ambulatory referral to Gastroenterology     Follow-up: Return if symptoms worsen or fail to improve.  An After Visit Summary was printed and given to the  patient.  Blane Ohara, MD [facility] [phone]

## 2020-12-10 ENCOUNTER — Encounter: Payer: Self-pay | Admitting: Family Medicine

## 2020-12-12 LAB — CBC WITH DIFFERENTIAL/PLATELET
Basophils Absolute: 0 10*3/uL (ref 0.0–0.2)
Basos: 0 %
EOS (ABSOLUTE): 0.2 10*3/uL (ref 0.0–0.4)
Eos: 2 %
Hematocrit: 49.6 % (ref 37.5–51.0)
Hemoglobin: 16.8 g/dL (ref 13.0–17.7)
Immature Grans (Abs): 0.1 10*3/uL (ref 0.0–0.1)
Immature Granulocytes: 1 %
Lymphocytes Absolute: 2 10*3/uL (ref 0.7–3.1)
Lymphs: 23 %
MCH: 31.1 pg (ref 26.6–33.0)
MCHC: 33.9 g/dL (ref 31.5–35.7)
MCV: 92 fL (ref 79–97)
Monocytes Absolute: 0.7 10*3/uL (ref 0.1–0.9)
Monocytes: 9 %
Neutrophils Absolute: 5.7 10*3/uL (ref 1.4–7.0)
Neutrophils: 65 %
Platelets: 170 10*3/uL (ref 150–450)
RBC: 5.41 x10E6/uL (ref 4.14–5.80)
RDW: 13.3 % (ref 11.6–15.4)
WBC: 8.6 10*3/uL (ref 3.4–10.8)

## 2020-12-12 LAB — COMPREHENSIVE METABOLIC PANEL
ALT: 19 IU/L (ref 0–44)
AST: 13 IU/L (ref 0–40)
Albumin/Globulin Ratio: 1.9 (ref 1.2–2.2)
Albumin: 4.2 g/dL (ref 3.8–4.8)
Alkaline Phosphatase: 110 IU/L (ref 44–121)
BUN/Creatinine Ratio: 18 (ref 10–24)
BUN: 21 mg/dL (ref 8–27)
Bilirubin Total: 0.4 mg/dL (ref 0.0–1.2)
CO2: 21 mmol/L (ref 20–29)
Calcium: 9.3 mg/dL (ref 8.6–10.2)
Chloride: 103 mmol/L (ref 96–106)
Creatinine, Ser: 1.2 mg/dL (ref 0.76–1.27)
Globulin, Total: 2.2 g/dL (ref 1.5–4.5)
Glucose: 97 mg/dL (ref 65–99)
Potassium: 5.1 mmol/L (ref 3.5–5.2)
Sodium: 141 mmol/L (ref 134–144)
Total Protein: 6.4 g/dL (ref 6.0–8.5)
eGFR: 65 mL/min/{1.73_m2} (ref >59)

## 2020-12-20 ENCOUNTER — Other Ambulatory Visit: Payer: Self-pay

## 2020-12-20 MED ORDER — VALSARTAN 160 MG PO TABS: 160.0000 mg | ORAL_TABLET | Freq: Every day | ORAL | 1 refills | Status: DC

## 2021-01-30 DIAGNOSIS — Z20822 Contact with and (suspected) exposure to covid-19: Secondary | ICD-10-CM | POA: Diagnosis not present

## 2021-02-21 DIAGNOSIS — Z1211 Encounter for screening for malignant neoplasm of colon: Secondary | ICD-10-CM | POA: Diagnosis not present

## 2021-02-21 DIAGNOSIS — M792 Neuralgia and neuritis, unspecified: Secondary | ICD-10-CM | POA: Diagnosis not present

## 2021-04-05 NOTE — Addendum Note (Signed)
Addended by: Verlan Friends on: 04/05/2021 02:22 PM   Modules accepted: Level of Service

## 2021-04-12 DIAGNOSIS — K621 Rectal polyp: Secondary | ICD-10-CM | POA: Diagnosis not present

## 2021-04-12 DIAGNOSIS — Z1211 Encounter for screening for malignant neoplasm of colon: Secondary | ICD-10-CM | POA: Diagnosis not present

## 2021-04-12 DIAGNOSIS — K635 Polyp of colon: Secondary | ICD-10-CM | POA: Diagnosis not present

## 2021-04-12 LAB — HM COLONOSCOPY

## 2021-05-08 ENCOUNTER — Other Ambulatory Visit: Payer: Self-pay | Admitting: Family Medicine

## 2021-06-02 ENCOUNTER — Ambulatory Visit: Payer: Medicare Other | Admitting: Family Medicine

## 2021-06-09 ENCOUNTER — Other Ambulatory Visit: Payer: Self-pay | Admitting: Family Medicine

## 2021-06-10 NOTE — Progress Notes (Signed)
Subjective:  Patient ID: Joel Harding, male    DOB: Dec 31, 1950  Age: 71 y.o. MRN: [id number]  Chief Complaint  Patient presents with   Hyperlipidemia   Hypertension    HPI: Prediabetes: a1c 5.9 Hyperlipidemia: Current medications: Simvastatin 40mg  1 tablet once daily, Fish Oil 1,000mg  2 capsules BID  Hypertension: Complications: Current medications: Valsartan 160mg  1 tablet daily  Diet: healthy Exercise: not much.    Current Outpatient Medications on File Prior to Visit  Medication Sig Dispense Refill   famciclovir (FAMVIR) 250 MG tablet TAKE 1 TABLET BY MOUTH TWICE DAILY 180 tablet 1   Omega-3 Fatty Acids (FISH OIL) 1000 MG CAPS Take 1,000 mg by mouth 2 (two) times daily.     simvastatin (ZOCOR) 40 MG tablet TAKE 1 TABLET(40 MG) BY MOUTH DAILY 90 tablet 1   valsartan (DIOVAN) 160 MG tablet Take 1 tablet (160 mg total) by mouth daily. 90 tablet 1   cyclobenzaprine (FLEXERIL) 10 MG tablet TAKE ONE TABLET BY MOUTH 3 TIMES DAILY (Patient not taking: Reported on 06/12/2021) 30 tablet 2   No current facility-administered medications on file prior to visit.   Past Medical History:  Diagnosis Date   Hypertension, essential 06/16/2008   Impaired fasting glucose 06/26/2010   Mixed hyperlipidemia 08/03/2008   Right nephrolithiasis    Past Surgical History:  Procedure Laterality Date   CYSTOSCOPY W/ URETERAL STENT PLACEMENT Right 10/28/2013   EYE SURGERY Left 11/2016   Cataract   EYE SURGERY Right 10/2016   Cataract   KNEE ARTHROSCOPY Left    LITHOTRIPSY      Family History  Problem Relation Age of Onset   Cancer Mother    Heart attack Father    Social History   Socioeconomic History   Marital status: Married    Spouse name: Not on file   Number of children: Not on file   Years of education: Not on file   Highest education level: Not on file  Occupational History   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  Tobacco Use   Smoking status: Former    Types: Cigarettes    Quit date:  06/05/1979    Years since quitting: 42.0   Smokeless tobacco: Never  Vaping Use   Vaping Use: Never used  Substance and Sexual Activity   Alcohol use: Never   Drug use: Never   Sexual activity: Not on file  Other Topics Concern   Not on file  Social History Narrative   Not on file   Social Determinants of Health   Financial Resource Strain: Not on file  Food Insecurity: Not on file  Transportation Needs: Not on file  Physical Activity: Inactive   Days of Exercise per Week: 0 days   Minutes of Exercise per Session: 0 min  Stress: Not on file  Social Connections: Not on file    Review of Systems  Constitutional:  Negative for appetite change, fatigue and fever.  HENT:  Negative for congestion, ear pain and sore throat.   Respiratory:  Positive for shortness of breath. Negative for cough.   Cardiovascular:  Negative for chest pain and leg swelling.  Gastrointestinal:  Negative for abdominal pain, constipation, diarrhea, nausea and vomiting.  Genitourinary:  Negative for dysuria and frequency.  Musculoskeletal:  Positive for arthralgias and back pain. Negative for myalgias.       Bilateral shoulder pain   Neurological:  Positive for numbness (middle fingers bilaterally last phalanx intermittent). Negative for dizziness and headaches.  Psychiatric/Behavioral:  Negative  for dysphoric mood. The patient is not nervous/anxious.     Objective:  BP 130/78    Pulse 88    Temp 98.7 F (37.1 C)    Resp 18    Ht 6' (1.829 m)    Wt 236 lb (107 kg)    BMI 32.01 kg/m   BP/Weight 06/12/2021 12/09/2020 11/21/2020  Systolic BP 130 132 114  Diastolic BP 78 75 72  Wt. (Lbs) 236 237 237  BMI 32.01 32.14 32.14    Physical Exam Vitals reviewed.  Constitutional:      Appearance: Normal appearance. He is normal weight.  Cardiovascular:     Rate and Rhythm: Normal rate and regular rhythm.     Pulses: Normal pulses.     Heart sounds: Normal heart sounds.  Pulmonary:     Breath sounds: Normal  breath sounds.  Abdominal:     General: Abdomen is flat. Bowel sounds are normal.     Palpations: Abdomen is soft.  Neurological:     Mental Status: He is alert and oriented to person, place, and time.     Sensory: Sensory deficit (numbness BL 3rd digits distal phalanx. negatve tinel's and phalen's.) present.  Psychiatric:        Mood and Affect: Mood normal.        Behavior: Behavior normal.    Diabetic Foot Exam - Simple   No data filed      Lab Results  Component Value Date   WBC 8.6 12/09/2020   HGB 16.8 12/09/2020   HCT 49.6 12/09/2020   PLT 170 12/09/2020   GLUCOSE 97 12/09/2020   CHOL 134 11/21/2020   TRIG 83 11/21/2020   HDL 41 11/21/2020   LDLCALC 77 11/21/2020   ALT 19 12/09/2020   AST 13 12/09/2020   NA 141 12/09/2020   K 5.1 12/09/2020   CL 103 12/09/2020   CREATININE 1.20 12/09/2020   BUN 21 12/09/2020   CO2 21 12/09/2020   HGBA1C 5.9 (H) 11/21/2020   MICROALBUR 150 11/21/2020      Assessment & Plan:   Problem List Items Addressed This Visit       Cardiovascular and Mediastinum   Hypertension, essential - Primary (Chronic)    Well controlled.  No changes to medicines.  Continue to work on eating a healthy diet and exercise.  Labs drawn today.        Relevant Orders   CBC With Diff/Platelet   Comprehensive metabolic panel     Endocrine   Impaired fasting glucose    Recommend continue to work on eating healthy diet and exercise.       Relevant Orders   Hemoglobin A1c     Musculoskeletal and Integument   Primary osteoarthritis of right shoulder    May return for kenalog injection if worsens.         Other   Mixed hyperlipidemia (Chronic)    Well controlled.  No changes to medicines.  Continue to work on eating a healthy diet and exercise.  Labs drawn today.        Relevant Orders   Lipid panel   Class 1 obesity due to excess calories without serious comorbidity with body mass index (BMI) of 32.0 to 32.9 in adult     Recommend continue to work on eating healthy diet and exercise.       Bilateral finger numbness    Nothing critical.  Distal compression.  Work on positional changes.      Marland Kitchen  Orders Placed This Encounter  Procedures   CBC With Diff/Platelet   Comprehensive metabolic panel   Lipid panel   Hemoglobin A1c     Follow-up: Return in about 6 months (around 12/10/2021) for chronic fasting.  An After Visit Summary was printed and given to the patient.  Blane OharaKirsten Danijah Noh, MD [facility] [phone]

## 2021-06-12 ENCOUNTER — Ambulatory Visit (INDEPENDENT_AMBULATORY_CARE_PROVIDER_SITE_OTHER): Payer: Medicare Other | Admitting: Family Medicine

## 2021-06-12 ENCOUNTER — Other Ambulatory Visit: Payer: Self-pay

## 2021-06-12 ENCOUNTER — Encounter: Payer: Self-pay | Admitting: Family Medicine

## 2021-06-12 VITALS — BP 130/78 | HR 88 | Temp 98.7°F | Resp 18 | Ht 72.0 in | Wt 236.0 lb

## 2021-06-12 DIAGNOSIS — E6609 Other obesity due to excess calories: Secondary | ICD-10-CM | POA: Diagnosis not present

## 2021-06-12 DIAGNOSIS — E782 Mixed hyperlipidemia: Secondary | ICD-10-CM

## 2021-06-12 DIAGNOSIS — R200 Anesthesia of skin: Secondary | ICD-10-CM | POA: Diagnosis not present

## 2021-06-12 DIAGNOSIS — R7301 Impaired fasting glucose: Secondary | ICD-10-CM | POA: Diagnosis not present

## 2021-06-12 DIAGNOSIS — I10 Essential (primary) hypertension: Secondary | ICD-10-CM | POA: Diagnosis not present

## 2021-06-12 DIAGNOSIS — Z6832 Body mass index (BMI) 32.0-32.9, adult: Secondary | ICD-10-CM | POA: Diagnosis not present

## 2021-06-12 DIAGNOSIS — Z23 Encounter for immunization: Secondary | ICD-10-CM

## 2021-06-12 DIAGNOSIS — M19011 Primary osteoarthritis, right shoulder: Secondary | ICD-10-CM | POA: Diagnosis not present

## 2021-06-12 NOTE — Assessment & Plan Note (Signed)
Nothing critical.  Distal compression.  Work on positional changes.

## 2021-06-12 NOTE — Assessment & Plan Note (Signed)
Well controlled.  ?No changes to medicines.  ?Continue to work on eating a healthy diet and exercise.  ?Labs drawn today.  ?

## 2021-06-12 NOTE — Assessment & Plan Note (Signed)
Recommend continue to work on eating healthy diet and exercise.  

## 2021-06-12 NOTE — Assessment & Plan Note (Signed)
May return for kenalog injection if worsens.

## 2021-06-13 LAB — CBC WITH DIFF/PLATELET
Basophils Absolute: 0 10*3/uL (ref 0.0–0.2)
Basos: 0 %
EOS (ABSOLUTE): 0.2 10*3/uL (ref 0.0–0.4)
Eos: 2 %
Hematocrit: 47.7 % (ref 37.5–51.0)
Hemoglobin: 16.2 g/dL (ref 13.0–17.7)
Immature Grans (Abs): 0 10*3/uL (ref 0.0–0.1)
Immature Granulocytes: 0 %
Lymphocytes Absolute: 2.1 10*3/uL (ref 0.7–3.1)
Lymphs: 30 %
MCH: 31.7 pg (ref 26.6–33.0)
MCHC: 34 g/dL (ref 31.5–35.7)
MCV: 93 fL (ref 79–97)
Monocytes Absolute: 0.6 10*3/uL (ref 0.1–0.9)
Monocytes: 8 %
Neutrophils Absolute: 4.1 10*3/uL (ref 1.4–7.0)
Neutrophils: 60 %
Platelets: 191 10*3/uL (ref 150–450)
RBC: 5.11 x10E6/uL (ref 4.14–5.80)
RDW: 13.1 % (ref 11.6–15.4)
WBC: 6.9 10*3/uL (ref 3.4–10.8)

## 2021-06-13 LAB — COMPREHENSIVE METABOLIC PANEL
ALT: 13 IU/L (ref 0–44)
AST: 17 IU/L (ref 0–40)
Albumin/Globulin Ratio: 2.6 — ABNORMAL HIGH (ref 1.2–2.2)
Albumin: 4.6 g/dL (ref 3.8–4.8)
Alkaline Phosphatase: 109 IU/L (ref 44–121)
BUN/Creatinine Ratio: 20 (ref 10–24)
BUN: 25 mg/dL (ref 8–27)
Bilirubin Total: 0.5 mg/dL (ref 0.0–1.2)
CO2: 24 mmol/L (ref 20–29)
Calcium: 9.6 mg/dL (ref 8.6–10.2)
Chloride: 105 mmol/L (ref 96–106)
Creatinine, Ser: 1.22 mg/dL (ref 0.76–1.27)
Globulin, Total: 1.8 g/dL (ref 1.5–4.5)
Glucose: 117 mg/dL — ABNORMAL HIGH (ref 70–99)
Potassium: 5 mmol/L (ref 3.5–5.2)
Sodium: 142 mmol/L (ref 134–144)
Total Protein: 6.4 g/dL (ref 6.0–8.5)
eGFR: 64 mL/min/{1.73_m2} (ref >59)

## 2021-06-13 LAB — LIPID PANEL
Chol/HDL Ratio: 3.5 ratio (ref 0.0–5.0)
Cholesterol, Total: 145 mg/dL (ref 100–199)
HDL: 42 mg/dL (ref >39)
LDL Chol Calc (NIH): 82 mg/dL (ref 0–99)
Triglycerides: 116 mg/dL (ref 0–149)
VLDL Cholesterol Cal: 21 mg/dL (ref 5–40)

## 2021-06-13 LAB — HEMOGLOBIN A1C
Est. average glucose Bld gHb Est-mCnc: 126 mg/dL
Hgb A1c MFr Bld: 6 % — ABNORMAL HIGH (ref 4.8–5.6)

## 2021-07-31 ENCOUNTER — Other Ambulatory Visit: Payer: Self-pay | Admitting: Physician Assistant

## 2021-08-22 DIAGNOSIS — Z20822 Contact with and (suspected) exposure to covid-19: Secondary | ICD-10-CM | POA: Diagnosis not present

## 2021-09-06 ENCOUNTER — Encounter: Payer: Self-pay | Admitting: Unknown Physician Specialty

## 2021-09-21 DIAGNOSIS — Z20822 Contact with and (suspected) exposure to covid-19: Secondary | ICD-10-CM | POA: Diagnosis not present

## 2021-10-06 DIAGNOSIS — H52223 Regular astigmatism, bilateral: Secondary | ICD-10-CM | POA: Diagnosis not present

## 2021-10-06 DIAGNOSIS — Z961 Presence of intraocular lens: Secondary | ICD-10-CM | POA: Diagnosis not present

## 2021-12-11 ENCOUNTER — Ambulatory Visit (INDEPENDENT_AMBULATORY_CARE_PROVIDER_SITE_OTHER): Payer: Medicare Other | Admitting: Family Medicine

## 2021-12-11 ENCOUNTER — Encounter: Payer: Self-pay | Admitting: Family Medicine

## 2021-12-11 VITALS — BP 136/70 | HR 76 | Temp 97.3°F | Resp 18 | Ht 72.0 in | Wt 239.0 lb

## 2021-12-11 DIAGNOSIS — E6609 Other obesity due to excess calories: Secondary | ICD-10-CM | POA: Diagnosis not present

## 2021-12-11 DIAGNOSIS — Z6832 Body mass index (BMI) 32.0-32.9, adult: Secondary | ICD-10-CM | POA: Diagnosis not present

## 2021-12-11 DIAGNOSIS — R7301 Impaired fasting glucose: Secondary | ICD-10-CM

## 2021-12-11 DIAGNOSIS — E782 Mixed hyperlipidemia: Secondary | ICD-10-CM | POA: Diagnosis not present

## 2021-12-11 DIAGNOSIS — I10 Essential (primary) hypertension: Secondary | ICD-10-CM

## 2021-12-11 DIAGNOSIS — A6000 Herpesviral infection of urogenital system, unspecified: Secondary | ICD-10-CM | POA: Diagnosis not present

## 2021-12-11 DIAGNOSIS — Z0000 Encounter for general adult medical examination without abnormal findings: Secondary | ICD-10-CM

## 2021-12-11 NOTE — Assessment & Plan Note (Signed)
Well controlled.  No changes to medicines. Continue Valsartan 160mg  1 tablet daily Continue to work on eating a healthy diet and exercise.  Labs drawn today.

## 2021-12-11 NOTE — Assessment & Plan Note (Signed)
Well controlled.  No changes to medicines. Continue Simvastatin 40mg  1 tablet once daily, Fish Oil 1,000mg  2 capsules TWICE DAILY Continue to work on eating a healthy diet and exercise.  Labs drawn today.

## 2021-12-11 NOTE — Progress Notes (Signed)
Patient: Joel Harding, Male    DOB: 07-25-1950, 71 y.o.   MRN: [id number]  Subjective  Chief Complaint  Patient presents with   Hypertension   Hyperlipidemia   Patient is here for AWV and chronic visit.  Joel Harding is a 71 y.o. male who presents today for his Annual Wellness Visit. He reports consuming a healthy diet. The patient does not participate in regular exercise at present. He generally feels well. He reports sleeping well. He does not have additional problems to discuss today.   HPI Prediabetes:  Eats healthy.  Hyperlipidemia: Current medications: Simvastatin 40mg  1 tablet once daily, Fish Oil 1,000mg  2 capsules BID Hypertension: Current medications: Valsartan 160mg  1 tablet daily History of herpes. Taking famvir 250 mg daily. Working well to prevent out breaks.   Vision:Within last year and Dental: No current dental problems  Medications: Outpatient Medications Prior to Visit  Medication Sig   cyclobenzaprine (FLEXERIL) 10 MG tablet TAKE ONE TABLET BY MOUTH 3 TIMES DAILY (Patient not taking: Reported on 06/12/2021)   famciclovir (FAMVIR) 250 MG tablet TAKE 1 TABLET BY MOUTH TWICE DAILY (Patient taking differently: Take 250 mg by mouth.)   Omega-3 Fatty Acids (FISH OIL) 1000 MG CAPS Take 1,000 mg by mouth 2 (two) times daily.   simvastatin (ZOCOR) 40 MG tablet TAKE 1 TABLET(40 MG) BY MOUTH DAILY   valsartan (DIOVAN) 160 MG tablet TAKE 1 TABLET(160 MG) BY MOUTH DAILY   No facility-administered medications prior to visit.    Allergies  Allergen Reactions   Lisinopril Cough    Patient Care Team: , MD as PCP - General (Family Medicine) 06/14/2021, MD as Consulting Physician (Orthopedic Surgery) Blane Ohara, MD (Gastroenterology)  Most recent functional status assessment:    12/11/2021    8:07 AM  In your present state of health, do you have any difficulty performing the following activities:  Preparing Food and eating ? N  Using the  Toilet? N  In the past six months, have you accidently leaked urine? N  Do you have problems with loss of bowel control? N  Managing your Medications? N  Managing your Finances? N  Housekeeping or managing your Housekeeping? N   Most recent fall risk assessment:    12/11/2021    7:27 AM  Fall Risk   Falls in the past year? 0  Number falls in past yr: 0  Injury with Fall? 0  Risk for fall due to : No Fall Risks  Follow up Falls evaluation completed    Most recent depression screenings:    12/11/2021    7:28 AM 06/12/2021    7:40 AM  PHQ 2/9 Scores  PHQ - 2 Score 0 0   Most recent cognitive screening:     No data to display         Most recent Audit-C alcohol use screening    12/11/2021    7:50 AM  Alcohol Use Disorder Test (AUDIT)  1. How often do you have a drink containing alcohol? 0  2. How many drinks containing alcohol do you have on a typical day when you are drinking? 0  3. How often do you have six or more drinks on one occasion? 0  AUDIT-C Score 0   A score of 3 or more in women, and 4 or more in men indicates increased risk for alcohol abuse, EXCEPT if all of the points are from question 1     Assessment & Plan  Annual wellness visit done today including the all of the following: Reviewed patient's Family Medical History Reviewed and updated list of patient's medical providers Assessment of cognitive impairment was done Assessed patient's functional ability Established a written schedule for health screening services Health Risk Assessent Completed and Reviewed  Exercise Activities and Dietary recommendations  Goals      Advanced Care Planning complete advance directive     Information given, he will complete and bring a copy for our file     Client understands the importance of follow-up with providers by attending scheduled visits        Immunization History  Administered Date(s) Administered   Fluad Quad(high Dose 65+) 02/29/2020,  06/12/2021   Influenza-Unspecified 05/04/2019   Pneumococcal Conjugate-13 11/22/2014   Pneumococcal Polysaccharide-23 06/30/2012, 09/25/2019   Tdap 11/22/2014    Health Maintenance  Topic Date Due   Zoster Vaccines- Shingrix (1 of 2) Never done   INFLUENZA VACCINE  12/26/2021   TETANUS/TDAP  11/21/2024   COLONOSCOPY (Pts 45-31yrs Insurance coverage will need to be confirmed)  04/13/2031   Pneumonia Vaccine 65+ Years old  Completed   Hepatitis C Screening  Completed   HPV VACCINES  Aged Out   COVID-19 Vaccine  Discontinued   Discussed health benefits of physical activity, and encouraged him to engage in regular exercise appropriate for his age and condition.    Problem List Items Addressed This Visit       Cardiovascular and Mediastinum   Hypertension, essential - Primary (Chronic)    Well controlled.  No changes to medicines. Continue Valsartan 160mg  1 tablet daily Continue to work on eating a healthy diet and exercise.  Labs drawn today.        Relevant Orders   CBC with Differential/Platelet   Comprehensive metabolic panel   TSH     Endocrine   Impaired fasting glucose    Recommend continue to work on eating healthy diet and exercise. Check A1C.      Relevant Orders   Hemoglobin A1c     Genitourinary   Genital herpes simplex    Continue famvir 250 mg daily.         Other   Mixed hyperlipidemia (Chronic)    Well controlled.  No changes to medicines. Continue Simvastatin 40mg  1 tablet once daily, Fish Oil 1,000mg  2 capsules TWICE DAILY Continue to work on eating a healthy diet and exercise.  Labs drawn today.        Relevant Orders   Lipid panel   TSH   Class 1 obesity due to excess calories without serious comorbidity with body mass index (BMI) of 32.0 to 32.9 in adult    Recommend continue to work on eating healthy diet and exercise.       General medical exam    Things to do to keep yourself healthy  - Exercise at least 30-45 minutes a day,  3-4 days a week.  - Eat a low-fat diet with lots of fruits and vegetables, up to 7-9 servings per day.  - Seatbelts can save your life. Wear them always.  - Smoke detectors on every level of your home, check batteries every year.  - Eye Doctor - have an eye exam every 1-2 years  - Safe sex - if you may be exposed to STDs, use a condom.  - Alcohol -  If you drink, do it moderately, less than 2 drinks per day.  - Health Care Power of Attorney. Choose someone to speak  for you if you are not able. Please bring [place] a copy.  - Depression is common in our stressful world.If you're feeling down or losing interest in things you normally enjoy, please come in for a visit.  - Violence - If anyone is threatening or hurting you, please call immediately.  Recommend shingrix vaccine. New MOST form filled out.       Return in about 6 months (around 06/13/2022) for chronic fasting.     Blane Ohara, MD

## 2021-12-11 NOTE — Assessment & Plan Note (Signed)
Continue famvir 250 mg daily.

## 2021-12-11 NOTE — Assessment & Plan Note (Signed)
Things to do to keep yourself healthy  - Exercise at least 30-45 minutes a day, 3-4 days a week.  - Eat a low-fat diet with lots of fruits and vegetables, up to 7-9 servings per day.  - Seatbelts can save your life. Wear them always.  - Smoke detectors on every level of your home, check batteries every year.  - Eye Doctor - have an eye exam every 1-2 years  - Safe sex - if you may be exposed to STDs, use a condom.  - Alcohol -  If you drink, do it moderately, less than 2 drinks per day.  - Health Care Power of Attorney. Choose someone to speak for you if you are not able. Please bring [place] a copy.  - Depression is common in our stressful world.If you're feeling down or losing interest in things you normally enjoy, please come in for a visit.  - Violence - If anyone is threatening or hurting you, please call immediately.  Recommend shingrix vaccine. New MOST form filled out.

## 2021-12-11 NOTE — Assessment & Plan Note (Signed)
Recommend continue to work on eating healthy diet and exercise.  

## 2021-12-11 NOTE — Assessment & Plan Note (Signed)
Recommend continue to work on eating healthy diet and exercise. Check A1C. 

## 2021-12-11 NOTE — Patient Instructions (Signed)

## 2021-12-11 NOTE — Progress Notes (Deleted)
Subjective:  Patient ID: Joel Harding, male    DOB: 01/02/1951  Age: 71 y.o. MRN: [id number]  Chief Complaint  Patient presents with   Hypertension   Hyperlipidemia    HPI Here for AWV and chronic follow up.   Functional Status Survey: Is the patient deaf or have difficulty hearing?: Yes Does the patient have difficulty seeing, even when wearing glasses/contacts?: No Does the patient have difficulty concentrating, remembering, or making decisions?: No Does the patient have difficulty walking or climbing stairs?: No Does the patient have difficulty dressing or bathing?: No Does the patient have difficulty doing errands alone such as visiting a doctor's office or shopping?: No     Prediabetes:  Eats healthy.   Hyperlipidemia: Current medications: Simvastatin 40mg  1 tablet once daily, Fish Oil 1,000mg  2 capsules BID  Hypertension: Current medications: Valsartan 160mg  1 tablet daily  History of herpes. Taking famvir 250 mg daily. Working well to prevent out breaks.   Diet: healthy Exercise: not much.    Current Outpatient Medications on File Prior to Visit  Medication Sig Dispense Refill   cyclobenzaprine (FLEXERIL) 10 MG tablet TAKE ONE TABLET BY MOUTH 3 TIMES DAILY (Patient not taking: Reported on 06/12/2021) 30 tablet 2   famciclovir (FAMVIR) 250 MG tablet TAKE 1 TABLET BY MOUTH TWICE DAILY (Patient taking differently: Take 250 mg by mouth.) 180 tablet 1   Omega-3 Fatty Acids (FISH OIL) 1000 MG CAPS Take 1,000 mg by mouth 2 (two) times daily.     simvastatin (ZOCOR) 40 MG tablet TAKE 1 TABLET(40 MG) BY MOUTH DAILY 90 tablet 1   valsartan (DIOVAN) 160 MG tablet TAKE 1 TABLET(160 MG) BY MOUTH DAILY 90 tablet 1   No current facility-administered medications on file prior to visit.   Past Medical History:  Diagnosis Date   Hypertension, essential 06/16/2008   Impaired fasting glucose 06/26/2010   Mixed hyperlipidemia 08/03/2008   Right nephrolithiasis    Past Surgical  History:  Procedure Laterality Date   CYSTOSCOPY W/ URETERAL STENT PLACEMENT Right 10/28/2013   EYE SURGERY Left 11/2016   Cataract   EYE SURGERY Right 10/2016   Cataract   KNEE ARTHROSCOPY Left    LITHOTRIPSY      Family History  Problem Relation Age of Onset   Cancer Mother    Heart attack Father    Social History   Socioeconomic History   Marital status: Married    Spouse name: Not on file   Number of children: Not on file   Years of education: Not on file   Highest education level: Not on file  Occupational History   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  Tobacco Use   Smoking status: Former    Types: Cigarettes    Quit date: 06/05/1979    Years since quitting: 42.5   Smokeless tobacco: Never  Vaping Use   Vaping Use: Never used  Substance and Sexual Activity   Alcohol use: Never   Drug use: Never   Sexual activity: Not on file  Other Topics Concern   Not on file  Social History Narrative   Not on file   Social Determinants of Health   Financial Resource Strain: Low Risk  (12/11/2021)   Overall Financial Resource Strain (CARDIA)    Difficulty of Paying Living Expenses: Not hard at all  Food Insecurity: No Food Insecurity (12/11/2021)   Hunger Vital Sign    Worried About Running Out of Food in the Last Year: Never true    Ran  Out of Food in the Last Year: Never true  Transportation Needs: No Transportation Needs (12/11/2021)   PRAPARE - Administrator, Civil Service (Medical): No    Lack of Transportation (Non-Medical): No  Physical Activity: Inactive (12/01/2020)   Exercise Vital Sign    Days of Exercise per Week: 0 days    Minutes of Exercise per Session: 0 min  Stress: No Stress Concern Present (12/11/2021)   [employer] of Occupational Health - Occupational Stress Questionnaire    Feeling of Stress : Not at all  Social Connections: Moderately Integrated (12/11/2021)   Social Connection and Isolation Panel [NHANES]    Frequency of Communication with  Friends and Family: More than three times a week    Frequency of Social Gatherings with Friends and Family: Once a week    Attends Religious Services: 1 to 4 times per year    Active Member of [employer] or Organizations: No    Attends Banker Meetings: Never    Marital Status: Married    Review of Systems  Constitutional:  Negative for chills and fever.  HENT:  Negative for congestion, rhinorrhea and sore throat.   Respiratory:  Negative for cough and shortness of breath.   Cardiovascular:  Negative for chest pain and palpitations.  Gastrointestinal:  Positive for abdominal pain (LLQ pain). Negative for constipation, diarrhea, nausea and vomiting.  Genitourinary:  Negative for dysuria and urgency.  Musculoskeletal:  Negative for arthralgias, back pain and myalgias.  Neurological:  Negative for dizziness and headaches.  Psychiatric/Behavioral:  Negative for dysphoric mood. The patient is not nervous/anxious.      Objective:  BP 136/70   Pulse 76   Temp (!) 97.3 F (36.3 C)   Resp 18   Ht 6' (1.829 m)   Wt 239 lb (108.4 kg)   BMI 32.41 kg/m      12/11/2021    7:24 AM 06/12/2021    8:01 AM 06/12/2021    7:36 AM  BP/Weight  Systolic BP 136 130 140  Diastolic BP 70 78 90  Wt. (Lbs) 239  236  BMI 32.41 kg/m2  32.01 kg/m2    Physical Exam Vitals reviewed.  Constitutional:      Appearance: Normal appearance. He is obese.  Neck:     Vascular: No carotid bruit.  Cardiovascular:     Rate and Rhythm: Normal rate and regular rhythm.     Heart sounds: Normal heart sounds.  Pulmonary:     Effort: Pulmonary effort is normal.     Breath sounds: Normal breath sounds. No wheezing, rhonchi or rales.  Abdominal:     General: Bowel sounds are normal.     Palpations: Abdomen is soft.     Tenderness: There is abdominal tenderness (mild LLQ).  Neurological:     Mental Status: He is alert and oriented to person, place, and time.  Psychiatric:        Mood and Affect: Mood  normal.        Behavior: Behavior normal.     Diabetic Foot Exam - Simple   No data filed      Lab Results  Component Value Date   WBC 6.9 06/12/2021   HGB 16.2 06/12/2021   HCT 47.7 06/12/2021   PLT 191 06/12/2021   GLUCOSE 117 (H) 06/12/2021   CHOL 145 06/12/2021   TRIG 116 06/12/2021   HDL 42 06/12/2021   LDLCALC 82 06/12/2021   ALT 13 06/12/2021   AST 17  06/12/2021   NA 142 06/12/2021   K 5.0 06/12/2021   CL 105 06/12/2021   CREATININE 1.22 06/12/2021   BUN 25 06/12/2021   CO2 24 06/12/2021   HGBA1C 6.0 (H) 06/12/2021   MICROALBUR 150 11/21/2020      Assessment & Plan:   Problem List Items Addressed This Visit       Cardiovascular and Mediastinum   Hypertension, essential - Primary (Chronic)    Well controlled.  No changes to medicines. Continue Valsartan 160mg  1 tablet daily Continue to work on eating a healthy diet and exercise.  Labs drawn today.        Relevant Orders   CBC with Differential/Platelet   Comprehensive metabolic panel   TSH     Endocrine   Impaired fasting glucose    Recommend continue to work on eating healthy diet and exercise. Check A1C.      Relevant Orders   Hemoglobin A1c     Genitourinary   Genital herpes simplex    Continue famvir 250 mg daily.         Other   Mixed hyperlipidemia (Chronic)    Well controlled.  No changes to medicines. Continue Simvastatin 40mg  1 tablet once daily, Fish Oil 1,000mg  2 capsules TWICE DAILY Continue to work on eating a healthy diet and exercise.  Labs drawn today.        Relevant Orders   Lipid panel   TSH   Class 1 obesity due to excess calories without serious comorbidity with body mass index (BMI) of 32.0 to 32.9 in adult    Recommend continue to work on eating healthy diet and exercise.       Other Visit Diagnoses     General medical exam         .  No orders of the defined types were placed in this encounter.   Orders Placed This Encounter  Procedures    CBC with Differential/Platelet   Comprehensive metabolic panel   Hemoglobin A1c   Lipid panel   TSH     Follow-up: No follow-ups on file.  An After Visit Summary was printed and given to the patient.  , MD [facility] [phone]

## 2021-12-12 LAB — COMPREHENSIVE METABOLIC PANEL
ALT: 17 IU/L (ref 0–44)
AST: 19 IU/L (ref 0–40)
Albumin/Globulin Ratio: 2.3 — ABNORMAL HIGH (ref 1.2–2.2)
Albumin: 4.3 g/dL (ref 3.8–4.8)
Alkaline Phosphatase: 95 IU/L (ref 44–121)
BUN/Creatinine Ratio: 18 (ref 10–24)
BUN: 25 mg/dL (ref 8–27)
Bilirubin Total: 0.3 mg/dL (ref 0.0–1.2)
CO2: 24 mmol/L (ref 20–29)
Calcium: 9.7 mg/dL (ref 8.6–10.2)
Chloride: 104 mmol/L (ref 96–106)
Creatinine, Ser: 1.37 mg/dL — ABNORMAL HIGH (ref 0.76–1.27)
Globulin, Total: 1.9 g/dL (ref 1.5–4.5)
Glucose: 115 mg/dL — ABNORMAL HIGH (ref 70–99)
Potassium: 5.4 mmol/L — ABNORMAL HIGH (ref 3.5–5.2)
Sodium: 141 mmol/L (ref 134–144)
Total Protein: 6.2 g/dL (ref 6.0–8.5)
eGFR: 55 mL/min/{1.73_m2} — ABNORMAL LOW (ref >59)

## 2021-12-12 LAB — LIPID PANEL
Chol/HDL Ratio: 3.3 ratio (ref 0.0–5.0)
Cholesterol, Total: 141 mg/dL (ref 100–199)
HDL: 43 mg/dL (ref >39)
LDL Chol Calc (NIH): 75 mg/dL (ref 0–99)
Triglycerides: 132 mg/dL (ref 0–149)
VLDL Cholesterol Cal: 23 mg/dL (ref 5–40)

## 2021-12-12 LAB — HEMOGLOBIN A1C
Est. average glucose Bld gHb Est-mCnc: 120 mg/dL
Hgb A1c MFr Bld: 5.8 % — ABNORMAL HIGH (ref 4.8–5.6)

## 2021-12-12 LAB — CBC WITH DIFFERENTIAL/PLATELET
Basophils Absolute: 0 10*3/uL (ref 0.0–0.2)
Basos: 1 %
EOS (ABSOLUTE): 0.1 10*3/uL (ref 0.0–0.4)
Eos: 2 %
Hematocrit: 44.7 % (ref 37.5–51.0)
Hemoglobin: 15.8 g/dL (ref 13.0–17.7)
Immature Grans (Abs): 0 10*3/uL (ref 0.0–0.1)
Immature Granulocytes: 0 %
Lymphocytes Absolute: 1.7 10*3/uL (ref 0.7–3.1)
Lymphs: 27 %
MCH: 32.5 pg (ref 26.6–33.0)
MCHC: 35.3 g/dL (ref 31.5–35.7)
MCV: 92 fL (ref 79–97)
Monocytes Absolute: 0.5 10*3/uL (ref 0.1–0.9)
Monocytes: 9 %
Neutrophils Absolute: 3.7 10*3/uL (ref 1.4–7.0)
Neutrophils: 61 %
Platelets: 180 10*3/uL (ref 150–450)
RBC: 4.86 x10E6/uL (ref 4.14–5.80)
RDW: 12.8 % (ref 11.6–15.4)
WBC: 6.1 10*3/uL (ref 3.4–10.8)

## 2021-12-12 LAB — TSH: TSH: 2.2 u[IU]/mL (ref 0.450–4.500)

## 2021-12-12 LAB — CARDIOVASCULAR RISK ASSESSMENT

## 2022-01-01 ENCOUNTER — Other Ambulatory Visit: Payer: Self-pay

## 2022-01-01 DIAGNOSIS — R110 Nausea: Secondary | ICD-10-CM | POA: Diagnosis not present

## 2022-01-01 DIAGNOSIS — R0981 Nasal congestion: Secondary | ICD-10-CM | POA: Diagnosis not present

## 2022-01-01 DIAGNOSIS — U071 COVID-19: Secondary | ICD-10-CM | POA: Diagnosis not present

## 2022-01-01 DIAGNOSIS — R519 Headache, unspecified: Secondary | ICD-10-CM | POA: Diagnosis not present

## 2022-01-01 NOTE — Telephone Encounter (Signed)
Peggy called with concerns about Joel Harding.  He has been having nausea, vomiting, abdominal pain.  He has had no fever, chills or abdominal tenderness.  Symptoms reviewed with  Dr. Sedalia Muta and she advised that he follow-up today at the Urgent Care for evaluation and treatment since we have no availability in the office.

## 2022-01-03 ENCOUNTER — Telehealth (INDEPENDENT_AMBULATORY_CARE_PROVIDER_SITE_OTHER): Payer: Medicare Other | Admitting: Nurse Practitioner

## 2022-01-03 ENCOUNTER — Encounter: Payer: Self-pay | Admitting: Nurse Practitioner

## 2022-01-03 DIAGNOSIS — U071 COVID-19: Secondary | ICD-10-CM | POA: Diagnosis not present

## 2022-01-03 DIAGNOSIS — R0602 Shortness of breath: Secondary | ICD-10-CM | POA: Diagnosis not present

## 2022-01-03 NOTE — Progress Notes (Signed)
Virtual Visit via Telephone Note   This visit type was conducted due to national recommendations for restrictions regarding the COVID-19 Pandemic (e.g. social distancing) in an effort to limit this patient's exposure and mitigate transmission in our community.  Due to his co-morbid illnesses, this patient is at least at moderate risk for complications without adequate follow up.  This format is felt to be most appropriate for this patient at this time.  The patient did not have access to video technology/had technical difficulties with video requiring transitioning to audio format only (telephone).  All issues noted in this document were discussed and addressed.  No physical exam could be performed with this format.  Patient verbally consented to a telehealth visit.   Date:  01/03/2022   ID:  Joel Harding, DOB 1950-08-19, MRN [id number]  Patient Location: Home Provider Location: Office/Clinic  PCP:  Blane Ohara, MD   Evaluation Performed:  Established pt, acute telemedicine visit  Chief Complaint:  Positive COVID-19  History of Present Illness:    Joel Harding is a 71 y.o. male tested positive for covid when seen at [facility] on 01/01/22. Treament includes Paxlovid and zofran. Pt  a had injections for migraine of Toradol and Phenergan at UC. Pt states he is drinking "a lot of soft drinks" Denies drinking water or broth. States he is having symptomatic bradycardia with dizziness and lightheadedness, pulses dropping to the 50's. States he is having dyspnea without activity, O2 sats decreasing to 90-91% on room air.Denies fever, chills, chest pain, or respiratory distress. Pt has comorbid conditions of hypertension, hyperlipidemia, prediabetes, and obesity. Former smoker, quit 1981. Weight 239 lbs, BMI 32. He did not have COVID-19 vaccines.   The patient does have symptoms concerning for COVID-19 infection (fever, chills, cough, or new shortness of breath).    Past Medical  History:  Diagnosis Date   Hypertension, essential 06/16/2008   Impaired fasting glucose 06/26/2010   Mixed hyperlipidemia 08/03/2008   Right nephrolithiasis     Past Surgical History:  Procedure Laterality Date   CYSTOSCOPY W/ URETERAL STENT PLACEMENT Right 10/28/2013   EYE SURGERY Left 11/2016   Cataract   EYE SURGERY Right 10/2016   Cataract   KNEE ARTHROSCOPY Left    LITHOTRIPSY      Family History  Problem Relation Age of Onset   Cancer Mother    Heart attack Father     Social History   Socioeconomic History   Marital status: Married    Spouse name: Not on file   Number of children: Not on file   Years of education: Not on file   Highest education level: Not on file  Occupational History   Occupation: Truck Hospital doctor  Tobacco Use   Smoking status: Former    Types: Cigarettes    Quit date: 06/05/1979    Years since quitting: 42.6   Smokeless tobacco: Never  Vaping Use   Vaping Use: Never used  Substance and Sexual Activity   Alcohol use: Never   Drug use: Never   Sexual activity: Not on file  Other Topics Concern   Not on file  Social History Narrative   Not on file   Social Determinants of Health   Financial Resource Strain: Low Risk  (12/11/2021)   Overall Financial Resource Strain (CARDIA)    Difficulty of Paying Living Expenses: Not hard at all  Food Insecurity: No Food Insecurity (12/11/2021)   Hunger Vital Sign    Worried About Running  Out of Food in the Last Year: Never true    Ran Out of Food in the Last Year: Never true  Transportation Needs: No Transportation Needs (12/11/2021)   PRAPARE - Administrator, Civil Service (Medical): No    Lack of Transportation (Non-Medical): No  Physical Activity: Inactive (12/01/2020)   Exercise Vital Sign    Days of Exercise per Week: 0 days    Minutes of Exercise per Session: 0 min  Stress: No Stress Concern Present (12/11/2021)   [employer] of Occupational Health - Occupational Stress  Questionnaire    Feeling of Stress : Not at all  Social Connections: Moderately Integrated (12/11/2021)   Social Connection and Isolation Panel [NHANES]    Frequency of Communication with Friends and Family: More than three times a week    Frequency of Social Gatherings with Friends and Family: Once a week    Attends Religious Services: 1 to 4 times per year    Active Member of [employer] or Organizations: No    Attends Banker Meetings: Never    Marital Status: Married  Catering manager Violence: Not At Risk (12/11/2021)   Humiliation, Afraid, Rape, and Kick questionnaire    Fear of Current or Ex-Partner: No    Emotionally Abused: No    Physically Abused: No    Sexually Abused: No    Outpatient Medications Prior to Visit  Medication Sig Dispense Refill   cyclobenzaprine (FLEXERIL) 10 MG tablet TAKE ONE TABLET BY MOUTH 3 TIMES DAILY (Patient not taking: Reported on 06/12/2021) 30 tablet 2   famciclovir (FAMVIR) 250 MG tablet TAKE 1 TABLET BY MOUTH TWICE DAILY (Patient taking differently: Take 250 mg by mouth.) 180 tablet 1   Omega-3 Fatty Acids (FISH OIL) 1000 MG CAPS Take 1,000 mg by mouth 2 (two) times daily.     simvastatin (ZOCOR) 40 MG tablet TAKE 1 TABLET(40 MG) BY MOUTH DAILY 90 tablet 1   valsartan (DIOVAN) 160 MG tablet TAKE 1 TABLET(160 MG) BY MOUTH DAILY 90 tablet 1   No facility-administered medications prior to visit.    Allergies:   Lisinopril   Social History   Tobacco Use   Smoking status: Former    Types: Cigarettes    Quit date: 06/05/1979    Years since quitting: 42.6   Smokeless tobacco: Never  Vaping Use   Vaping Use: Never used  Substance Use Topics   Alcohol use: Never   Drug use: Never     ROS  See pertinent positives and negatives per HPI.  Labs/Other Tests and Data Reviewed:    Recent Labs: 12/11/2021: ALT 17; BUN 25; Creatinine, Ser 1.37; Hemoglobin 15.8; Platelets 180; Potassium 5.4; Sodium 141; TSH 2.200   Recent Lipid Panel Lab  Results  Component Value Date/Time   CHOL 141 12/11/2021 11:06 AM   TRIG 132 12/11/2021 11:06 AM   HDL 43 12/11/2021 11:06 AM   CHOLHDL 3.3 12/11/2021 11:06 AM   LDLCALC 75 12/11/2021 11:06 AM    Wt Readings from Last 3 Encounters:  12/11/21 239 lb (108.4 kg)  06/12/21 236 lb (107 kg)  12/09/20 237 lb (107.5 kg)     Objective:    Vital Signs:  There were no vitals taken for this visit.    Physical Exam No physical exam due to telemedicine visit  ASSESSMENT & PLAN:       1. COVID-19  -recommend go to ED due to symptomatic bradycardia and dyspnea -rest and push fluids -continue Paxlovid  as prescribed     COVID-19 Education: The signs and symptoms of COVID-19 were discussed with the patient and how to seek care for testing (follow up with PCP or arrange E-visit). The importance of social distancing was discussed today.   I spent 15 minutes dedicated to the care of this patient on the date of this encounter to include face-to-face time with the patient, as well as: EMR review.   Follow Up:  In Person prn  Signed,  Flonnie Hailstone, DNP  01/03/2022 4:51 PM    [facility]

## 2022-01-12 ENCOUNTER — Other Ambulatory Visit: Payer: Medicare Other

## 2022-01-12 DIAGNOSIS — N289 Disorder of kidney and ureter, unspecified: Secondary | ICD-10-CM | POA: Diagnosis not present

## 2022-01-12 LAB — COMPREHENSIVE METABOLIC PANEL
ALT: 11 IU/L (ref 0–44)
AST: 14 IU/L (ref 0–40)
Albumin/Globulin Ratio: 2 (ref 1.2–2.2)
Albumin: 4.2 g/dL (ref 3.8–4.8)
Alkaline Phosphatase: 102 IU/L (ref 44–121)
BUN/Creatinine Ratio: 14 (ref 10–24)
BUN: 16 mg/dL (ref 8–27)
Bilirubin Total: 0.4 mg/dL (ref 0.0–1.2)
CO2: 23 mmol/L (ref 20–29)
Calcium: 9.3 mg/dL (ref 8.6–10.2)
Chloride: 104 mmol/L (ref 96–106)
Creatinine, Ser: 1.15 mg/dL (ref 0.76–1.27)
Globulin, Total: 2.1 g/dL (ref 1.5–4.5)
Glucose: 100 mg/dL — ABNORMAL HIGH (ref 70–99)
Potassium: 4.6 mmol/L (ref 3.5–5.2)
Sodium: 141 mmol/L (ref 134–144)
Total Protein: 6.3 g/dL (ref 6.0–8.5)
eGFR: 68 mL/min/{1.73_m2} (ref >59)

## 2022-02-18 ENCOUNTER — Other Ambulatory Visit: Payer: Self-pay | Admitting: Physician Assistant

## 2022-02-26 DIAGNOSIS — J069 Acute upper respiratory infection, unspecified: Secondary | ICD-10-CM | POA: Diagnosis not present

## 2022-02-26 DIAGNOSIS — J029 Acute pharyngitis, unspecified: Secondary | ICD-10-CM | POA: Diagnosis not present

## 2022-05-07 ENCOUNTER — Other Ambulatory Visit: Payer: Self-pay

## 2022-05-07 MED ORDER — SIMVASTATIN 40 MG PO TABS: ORAL_TABLET | ORAL | 0 refills | Status: DC

## 2022-05-18 DIAGNOSIS — M549 Dorsalgia, unspecified: Secondary | ICD-10-CM | POA: Diagnosis not present

## 2022-05-18 DIAGNOSIS — M62830 Muscle spasm of back: Secondary | ICD-10-CM | POA: Diagnosis not present

## 2022-05-18 DIAGNOSIS — S39012A Strain of muscle, fascia and tendon of lower back, initial encounter: Secondary | ICD-10-CM | POA: Diagnosis not present

## 2022-05-19 ENCOUNTER — Other Ambulatory Visit: Payer: Self-pay | Admitting: Family Medicine

## 2022-05-21 DIAGNOSIS — M47816 Spondylosis without myelopathy or radiculopathy, lumbar region: Secondary | ICD-10-CM | POA: Diagnosis not present

## 2022-05-21 DIAGNOSIS — S335XXA Sprain of ligaments of lumbar spine, initial encounter: Secondary | ICD-10-CM | POA: Diagnosis not present

## 2022-05-21 DIAGNOSIS — S39012A Strain of muscle, fascia and tendon of lower back, initial encounter: Secondary | ICD-10-CM | POA: Diagnosis not present

## 2022-05-21 DIAGNOSIS — R109 Unspecified abdominal pain: Secondary | ICD-10-CM | POA: Diagnosis not present

## 2022-05-21 DIAGNOSIS — M5136 Other intervertebral disc degeneration, lumbar region: Secondary | ICD-10-CM | POA: Diagnosis not present

## 2022-05-24 DIAGNOSIS — M5136 Other intervertebral disc degeneration, lumbar region: Secondary | ICD-10-CM | POA: Diagnosis not present

## 2022-05-30 ENCOUNTER — Telehealth: Payer: Self-pay

## 2022-05-30 NOTE — Telephone Encounter (Signed)
        Patient  visited [place] on 12/25     Telephone encounter attempt :  2nd  A HIPAA compliant voice message was left requesting a return call.  Instructed patient to call back .   Numa Heatwole Pop Health Care Guide, [place], Care Management  [phone] [address], [place], [place] [address] Phone: [phone] Email: [email or web address]       

## 2022-06-15 NOTE — Progress Notes (Signed)
Subjective:  Patient ID: Joel Harding, male    DOB: 11/24/1950  Age: 72 y.o. MRN: [id number]  Chief Complaint  Patient presents with   Hypertension   Hyperlipidemia   HPI: Prediabetes:  Eats healthy.  Hyperlipidemia: Current medications: Simvastatin 40mg  1 tablet once daily, Fish Oil 1,000mg  2 capsules BID Hypertension: Current medications: Valsartan 160mg  1 tablet daily History of herpes. Taking famvir 250 mg daily. Working well to prevent out breaks.   Patient was seen in ED at the end of December 2023 for  back pian. Given flexeril and sent to orthopedics. Orthopedics gave him meloxicam 7.5 mg one daily as needed. CT of back showed DDD and some foraminal stenosis and mild spinal stenosis. Also found an incidental exophytic cystic lesion 2.6 cm. Increased from 1.7.    Current Outpatient Medications on File Prior to Visit  Medication Sig Dispense Refill   famciclovir (FAMVIR) 250 MG tablet TAKE 1 TABLET BY MOUTH TWICE A DAY 180 tablet 1   meloxicam (MOBIC) 7.5 MG tablet Take 7.5 mg by mouth daily.     Omega-3 Fatty Acids (FISH OIL) 1000 MG CAPS Take 1,000 mg by mouth 2 (two) times daily.     simvastatin (ZOCOR) 40 MG tablet TAKE 1 TABLET(40 MG) BY MOUTH DAILY 90 tablet 0   valsartan (DIOVAN) 160 MG tablet TAKE 1 TABLET(160 MG) BY MOUTH DAILY 90 tablet 0   No current facility-administered medications on file prior to visit.   Past Medical History:  Diagnosis Date   Hypertension, essential 06/16/2008   Impaired fasting glucose 06/26/2010   Mixed hyperlipidemia 08/03/2008   Right nephrolithiasis    Past Surgical History:  Procedure Laterality Date   CYSTOSCOPY W/ URETERAL STENT PLACEMENT Right 10/28/2013   EYE SURGERY Left 11/2016   Cataract   EYE SURGERY Right 10/2016   Cataract   KNEE ARTHROSCOPY Left    LITHOTRIPSY      Family History  Problem Relation Age of Onset   Cancer Mother    Heart attack Father    Social History   Socioeconomic History   Marital status:  Married    Spouse name: Not on file   Number of children: Not on file   Years of education: Not on file   Highest education level: Not on file  Occupational History   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  Tobacco Use   Smoking status: Former    Types: Cigarettes    Quit date: 06/05/1979    Years since quitting: 43.0   Smokeless tobacco: Never  Vaping Use   Vaping Use: Never used  Substance and Sexual Activity   Alcohol use: Never   Drug use: Never   Sexual activity: Not on file  Other Topics Concern   Not on file  Social History Narrative   Not on file   Social Determinants of Health   Financial Resource Strain: Low Risk  (12/11/2021)   Overall Financial Resource Strain (CARDIA)    Difficulty of Paying Living Expenses: Not hard at all  Food Insecurity: No Food Insecurity (12/11/2021)   Hunger Vital Sign    Worried About Running Out of Food in the Last Year: Never true    Ran Out of Food in the Last Year: Never true  Transportation Needs: No Transportation Needs (12/11/2021)   PRAPARE - Hydrologist (Medical): No    Lack of Transportation (Non-Medical): No  Physical Activity: Inactive (12/01/2020)   Exercise Vital Sign    Days of  Exercise per Week: 0 days    Minutes of Exercise per Session: 0 min  Stress: No Stress Concern Present (12/11/2021)   [employer] of Occupational Health - Occupational Stress Questionnaire    Feeling of Stress : Not at all  Social Connections: Moderately Integrated (12/11/2021)   Social Connection and Isolation Panel [NHANES]    Frequency of Communication with Friends and Family: More than three times a week    Frequency of Social Gatherings with Friends and Family: Once a week    Attends Religious Services: 1 to 4 times per year    Active Member of [employer] or Organizations: No    Attends Banker Meetings: Never    Marital Status: Married    Review of Systems  Constitutional:  Negative for appetite change, fatigue  and fever.  HENT:  Negative for congestion, ear pain, sinus pressure and sore throat.   Respiratory:  Negative for cough, chest tightness, shortness of breath and wheezing.   Cardiovascular:  Negative for chest pain and palpitations.  Gastrointestinal:  Negative for abdominal pain, constipation, diarrhea, nausea and vomiting.  Genitourinary:  Negative for dysuria and hematuria.  Musculoskeletal:  Positive for back pain. Negative for arthralgias, joint swelling and myalgias.  Skin:  Negative for rash.  Neurological:  Negative for dizziness, weakness and headaches.  Psychiatric/Behavioral:  Negative for dysphoric mood. The patient is not nervous/anxious.      Objective:  BP 124/74   Pulse 60   Resp 16   Ht 6\' 1"  (1.854 m)   Wt 230 lb (104.3 kg)   BMI 30.34 kg/m      06/18/2022    7:34 AM 12/11/2021    7:24 AM 06/12/2021    8:01 AM  BP/Weight  Systolic BP 124 136 130  Diastolic BP 74 70 78  Wt. (Lbs) 230 239   BMI 30.34 kg/m2 32.41 kg/m2     Physical Exam Vitals reviewed.  Constitutional:      Appearance: Normal appearance. He is normal weight.  Cardiovascular:     Rate and Rhythm: Normal rate and regular rhythm.     Heart sounds: Normal heart sounds.  Pulmonary:     Effort: Pulmonary effort is normal.     Breath sounds: Normal breath sounds.  Abdominal:     General: Abdomen is flat. Bowel sounds are normal.     Palpations: Abdomen is soft.  Neurological:     Mental Status: He is alert and oriented to person, place, and time.  Psychiatric:        Mood and Affect: Mood normal.        Behavior: Behavior normal.     Diabetic Foot Exam - Simple   No data filed      Lab Results  Component Value Date   WBC 6.1 12/11/2021   HGB 15.8 12/11/2021   HCT 44.7 12/11/2021   PLT 180 12/11/2021   GLUCOSE 100 (H) 01/12/2022   CHOL 141 12/11/2021   TRIG 132 12/11/2021   HDL 43 12/11/2021   LDLCALC 75 12/11/2021   ALT 11 01/12/2022   AST 14 01/12/2022   NA 141  01/12/2022   K 4.6 01/12/2022   CL 104 01/12/2022   CREATININE 1.15 01/12/2022   BUN 16 01/12/2022   CO2 23 01/12/2022   TSH 2.200 12/11/2021   HGBA1C 5.8 (H) 12/11/2021   MICROALBUR 150 11/21/2020      Assessment & Plan:   Kedarius was seen today for hypertension and hyperlipidemia.  Hypertension, essential Assessment & Plan: Well controlled.  No changes to medicines.  Continue to work on eating a healthy diet and exercise.  Labs drawn today.    Orders: -     CBC With Diff/Platelet -     Comprehensive metabolic panel  Impaired fasting glucose Assessment & Plan: Recommend continue to work on eating healthy diet and exercise.   Orders: -     Hemoglobin A1c  Mixed hyperlipidemia Assessment & Plan: Well controlled.  No changes to medicines.  Continue to work on eating a healthy diet and exercise.  Labs drawn today.    Orders: -     Lipid panel  Left renal mass Assessment & Plan: ORDER MRI RENAL PROTOCOL WITH AND WITHOUT CONTRAST.   Orders: -     MR ABDOMEN W WO CONTRAST; Future  Class 1 obesity due to excess calories with serious comorbidity and body mass index (BMI) of 30.0 to 30.9 in adult     No orders of the defined types were placed in this encounter.   Orders Placed This Encounter  Procedures   MR Abdomen W Wo Contrast   CBC With Diff/Platelet   Comprehensive metabolic panel   Lipid panel   Hemoglobin A1c     Follow-up: Return in about 6 months (around 12/17/2022) for awv, chronic fasting.  An After Visit Summary was printed and given to the patient.  Rochel Brome, MD [facility] [phone]

## 2022-06-18 ENCOUNTER — Encounter: Payer: Self-pay | Admitting: Family Medicine

## 2022-06-18 ENCOUNTER — Ambulatory Visit (INDEPENDENT_AMBULATORY_CARE_PROVIDER_SITE_OTHER): Payer: Medicare Other | Admitting: Family Medicine

## 2022-06-18 VITALS — BP 124/74 | HR 60 | Resp 16 | Ht 73.0 in | Wt 230.0 lb

## 2022-06-18 DIAGNOSIS — I10 Essential (primary) hypertension: Secondary | ICD-10-CM | POA: Diagnosis not present

## 2022-06-18 DIAGNOSIS — N2889 Other specified disorders of kidney and ureter: Secondary | ICD-10-CM | POA: Diagnosis not present

## 2022-06-18 DIAGNOSIS — Z23 Encounter for immunization: Secondary | ICD-10-CM

## 2022-06-18 DIAGNOSIS — E6609 Other obesity due to excess calories: Secondary | ICD-10-CM

## 2022-06-18 DIAGNOSIS — Z6830 Body mass index (BMI) 30.0-30.9, adult: Secondary | ICD-10-CM | POA: Diagnosis not present

## 2022-06-18 DIAGNOSIS — R7301 Impaired fasting glucose: Secondary | ICD-10-CM | POA: Diagnosis not present

## 2022-06-18 DIAGNOSIS — E782 Mixed hyperlipidemia: Secondary | ICD-10-CM | POA: Diagnosis not present

## 2022-06-18 NOTE — Assessment & Plan Note (Signed)
Well controlled.  ?No changes to medicines.  ?Continue to work on eating a healthy diet and exercise.  ?Labs drawn today.  ?

## 2022-06-18 NOTE — Addendum Note (Signed)
Addended by: Effie Shy on: 06/18/2022 08:26 AM   Modules accepted: Orders

## 2022-06-18 NOTE — Assessment & Plan Note (Signed)
ORDER MRI RENAL PROTOCOL WITH AND WITHOUT CONTRAST.

## 2022-06-18 NOTE — Assessment & Plan Note (Signed)
Recommend continue to work on eating healthy diet and exercise.  

## 2022-06-19 LAB — CBC WITH DIFF/PLATELET
Basophils Absolute: 0 10*3/uL (ref 0.0–0.2)
Basos: 1 %
EOS (ABSOLUTE): 0.1 10*3/uL (ref 0.0–0.4)
Eos: 2 %
Hematocrit: 43.8 % (ref 37.5–51.0)
Hemoglobin: 15.2 g/dL (ref 13.0–17.7)
Immature Grans (Abs): 0 10*3/uL (ref 0.0–0.1)
Immature Granulocytes: 0 %
Lymphocytes Absolute: 1.9 10*3/uL (ref 0.7–3.1)
Lymphs: 30 %
MCH: 32.2 pg (ref 26.6–33.0)
MCHC: 34.7 g/dL (ref 31.5–35.7)
MCV: 93 fL (ref 79–97)
Monocytes Absolute: 0.4 10*3/uL (ref 0.1–0.9)
Monocytes: 7 %
Neutrophils Absolute: 3.8 10*3/uL (ref 1.4–7.0)
Neutrophils: 60 %
Platelets: 183 10*3/uL (ref 150–450)
RBC: 4.72 x10E6/uL (ref 4.14–5.80)
RDW: 12.4 % (ref 11.6–15.4)
WBC: 6.3 10*3/uL (ref 3.4–10.8)

## 2022-06-19 LAB — COMPREHENSIVE METABOLIC PANEL
ALT: 19 IU/L (ref 0–44)
AST: 21 IU/L (ref 0–40)
Albumin/Globulin Ratio: 2.3 — ABNORMAL HIGH (ref 1.2–2.2)
Albumin: 4.4 g/dL (ref 3.8–4.8)
Alkaline Phosphatase: 79 IU/L (ref 44–121)
BUN/Creatinine Ratio: 20 (ref 10–24)
BUN: 23 mg/dL (ref 8–27)
Bilirubin Total: 0.5 mg/dL (ref 0.0–1.2)
CO2: 24 mmol/L (ref 20–29)
Calcium: 9.2 mg/dL (ref 8.6–10.2)
Chloride: 105 mmol/L (ref 96–106)
Creatinine, Ser: 1.14 mg/dL (ref 0.76–1.27)
Globulin, Total: 1.9 g/dL (ref 1.5–4.5)
Glucose: 110 mg/dL — ABNORMAL HIGH (ref 70–99)
Potassium: 4.7 mmol/L (ref 3.5–5.2)
Sodium: 144 mmol/L (ref 134–144)
Total Protein: 6.3 g/dL (ref 6.0–8.5)
eGFR: 69 mL/min/{1.73_m2} (ref >59)

## 2022-06-19 LAB — LIPID PANEL
Chol/HDL Ratio: 2.7 ratio (ref 0.0–5.0)
Cholesterol, Total: 134 mg/dL (ref 100–199)
HDL: 49 mg/dL (ref >39)
LDL Chol Calc (NIH): 67 mg/dL (ref 0–99)
Triglycerides: 95 mg/dL (ref 0–149)
VLDL Cholesterol Cal: 18 mg/dL (ref 5–40)

## 2022-06-19 LAB — HEMOGLOBIN A1C
Est. average glucose Bld gHb Est-mCnc: 131 mg/dL
Hgb A1c MFr Bld: 6.2 % — ABNORMAL HIGH (ref 4.8–5.6)

## 2022-06-19 LAB — CARDIOVASCULAR RISK ASSESSMENT

## 2022-06-25 ENCOUNTER — Encounter: Payer: Self-pay | Admitting: Family Medicine

## 2022-07-02 ENCOUNTER — Encounter: Payer: Self-pay | Admitting: Family Medicine

## 2022-07-02 ENCOUNTER — Other Ambulatory Visit: Payer: Self-pay | Admitting: Family Medicine

## 2022-07-02 DIAGNOSIS — K3189 Other diseases of stomach and duodenum: Secondary | ICD-10-CM

## 2022-07-02 DIAGNOSIS — N281 Cyst of kidney, acquired: Secondary | ICD-10-CM | POA: Diagnosis not present

## 2022-07-02 DIAGNOSIS — D1800 Hemangioma unspecified site: Secondary | ICD-10-CM | POA: Diagnosis not present

## 2022-07-02 DIAGNOSIS — K8689 Other specified diseases of pancreas: Secondary | ICD-10-CM | POA: Diagnosis not present

## 2022-07-02 DIAGNOSIS — N2889 Other specified disorders of kidney and ureter: Secondary | ICD-10-CM | POA: Diagnosis not present

## 2022-07-12 DIAGNOSIS — K3189 Other diseases of stomach and duodenum: Secondary | ICD-10-CM | POA: Diagnosis not present

## 2022-07-12 DIAGNOSIS — R935 Abnormal findings on diagnostic imaging of other abdominal regions, including retroperitoneum: Secondary | ICD-10-CM | POA: Diagnosis not present

## 2022-07-12 DIAGNOSIS — R634 Abnormal weight loss: Secondary | ICD-10-CM | POA: Diagnosis not present

## 2022-08-03 DIAGNOSIS — R634 Abnormal weight loss: Secondary | ICD-10-CM | POA: Diagnosis not present

## 2022-08-03 DIAGNOSIS — R935 Abnormal findings on diagnostic imaging of other abdominal regions, including retroperitoneum: Secondary | ICD-10-CM | POA: Diagnosis not present

## 2022-08-03 DIAGNOSIS — K319 Disease of stomach and duodenum, unspecified: Secondary | ICD-10-CM | POA: Diagnosis not present

## 2022-08-12 ENCOUNTER — Other Ambulatory Visit: Payer: Self-pay | Admitting: Physician Assistant

## 2022-08-16 ENCOUNTER — Other Ambulatory Visit: Payer: Self-pay | Admitting: Physician Assistant

## 2022-08-16 ENCOUNTER — Other Ambulatory Visit: Payer: Self-pay | Admitting: Family Medicine

## 2022-09-03 DIAGNOSIS — K3189 Other diseases of stomach and duodenum: Secondary | ICD-10-CM | POA: Diagnosis not present

## 2022-09-03 DIAGNOSIS — K8020 Calculus of gallbladder without cholecystitis without obstruction: Secondary | ICD-10-CM | POA: Diagnosis not present

## 2022-09-19 ENCOUNTER — Other Ambulatory Visit: Payer: Self-pay

## 2022-09-19 DIAGNOSIS — N2889 Other specified disorders of kidney and ureter: Secondary | ICD-10-CM

## 2022-11-14 ENCOUNTER — Other Ambulatory Visit: Payer: Self-pay | Admitting: Family Medicine

## 2022-12-13 NOTE — Progress Notes (Unsigned)
Subjective:   Joel Harding is a 72 y.o. male who presents for Medicare Annual/Subsequent preventive examination.  Visit Complete: {VISITMETHOD:[phone]}  Patient Medicare AWV questionnaire was completed by the patient on ***; I have confirmed that all information answered by patient is correct and no changes since this date.  Review of Systems    ***       Objective:    There were no vitals filed for this visit. There is no height or weight on file to calculate BMI.     12/01/2020    9:02 PM 09/25/2019    9:19 AM  Advanced Directives  Does Patient Have a Medical Advance Directive? No No  Would patient like information on creating a medical advance directive?  Yes (MAU/Ambulatory/Procedural Areas - Information given)    Current Medications (verified) Outpatient Encounter Medications as of 12/14/2022  Medication Sig   famciclovir (FAMVIR) 250 MG tablet TAKE 1 TABLET BY MOUTH TWICE DAILY   meloxicam (MOBIC) 7.5 MG tablet Take 7.5 mg by mouth daily.   Omega-3 Fatty Acids (FISH OIL) 1000 MG CAPS Take 1,000 mg by mouth 2 (two) times daily.   simvastatin (ZOCOR) 40 MG tablet TAKE 1 TABLET(40 MG) BY MOUTH DAILY   valsartan (DIOVAN) 160 MG tablet TAKE 1 TABLET(160 MG) BY MOUTH DAILY   No facility-administered encounter medications on file as of 12/14/2022.    Allergies (verified) Lisinopril   History: Past Medical History:  Diagnosis Date   Hypertension, essential 06/16/2008   Impaired fasting glucose 06/26/2010   Mixed hyperlipidemia 08/03/2008   Right nephrolithiasis    Past Surgical History:  Procedure Laterality Date   CYSTOSCOPY W/ URETERAL STENT PLACEMENT Right 10/28/2013   EYE SURGERY Left 11/2016   Cataract   EYE SURGERY Right 10/2016   Cataract   KNEE ARTHROSCOPY Left    LITHOTRIPSY     Family History  Problem Relation Age of Onset   Cancer Mother    Heart attack Father    Social History   Socioeconomic History   Marital status: Married    Spouse  name: Not on file   Number of children: Not on file   Years of education: Not on file   Highest education level: Not on file  Occupational History   Occupation: Truck Hospital doctor  Tobacco Use   Smoking status: Former    Current packs/day: 0.00    Types: Cigarettes    Quit date: 06/05/1979    Years since quitting: 43.5   Smokeless tobacco: Never  Vaping Use   Vaping status: Never Used  Substance and Sexual Activity   Alcohol use: Never   Drug use: Never   Sexual activity: Not on file  Other Topics Concern   Not on file  Social History Narrative   Not on file   Social Determinants of Health   Financial Resource Strain: Low Risk  (12/11/2021)   Overall Financial Resource Strain (CARDIA)    Difficulty of Paying Living Expenses: Not hard at all  Food Insecurity: No Food Insecurity (12/11/2021)   Hunger Vital Sign    Worried About Running Out of Food in the Last Year: Never true    Ran Out of Food in the Last Year: Never true  Transportation Needs: No Transportation Needs (12/11/2021)   PRAPARE - Administrator, Civil Service (Medical): No    Lack of Transportation (Non-Medical): No  Physical Activity: Inactive (12/01/2020)   Exercise Vital Sign    Days of Exercise per Week:  0 days    Minutes of Exercise per Session: 0 min  Stress: No Stress Concern Present (12/11/2021)   [employer] of Occupational Health - Occupational Stress Questionnaire    Feeling of Stress : Not at all  Social Connections: Moderately Integrated (12/11/2021)   Social Connection and Isolation Panel [NHANES]    Frequency of Communication with Friends and Family: More than three times a week    Frequency of Social Gatherings with Friends and Family: Once a week    Attends Religious Services: 1 to 4 times per year    Active Member of [employer] or Organizations: No    Attends Banker Meetings: Never    Marital Status: Married    Tobacco Counseling Counseling given: Not  Answered   Clinical Intake:                        Activities of Daily Living     No data to display           Patient Care Team: Blane Ohara, MD as PCP - General (Family Medicine) Garnette Gunner, MD as Consulting Physician (Orthopedic Surgery) Webb Silversmith, MD (Gastroenterology)  Indicate any recent Medical Services you may have received from other than [facility] providers in the past year (date may be approximate).     Assessment:   This is a routine wellness examination for [name].  Hearing/Vision screen No results found.  Dietary issues and exercise activities discussed:     Goals Addressed   None   Depression Screen    12/11/2021    7:28 AM 06/12/2021    7:40 AM 11/21/2020    8:14 AM 11/02/2019    8:16 AM 09/25/2019    9:20 AM  PHQ 2/9 Scores  PHQ - 2 Score 0 0 0 0 0    Fall Risk    12/11/2021    7:27 AM 06/12/2021    7:40 AM 11/21/2020    8:14 AM 11/02/2019    8:16 AM 09/25/2019    8:38 AM  Fall Risk   Falls in the past year? 0 0 0 0 0  Number falls in past yr: 0 0 0 0   Injury with Fall? 0 0 0    Risk for fall due to : No Fall Risks  No Fall Risks    Follow up Falls evaluation completed Falls evaluation completed Falls evaluation completed      MEDICARE RISK AT HOME:   TIMED UP AND GO:  Was the test performed?  {AMBTIMEDUPGO:[phone]}    Cognitive Function:    09/25/2019    9:21 AM  MMSE - Mini Mental State Exam  Orientation to time 5  Orientation to Place 5  Registration 3  Attention/ Calculation 5  Recall 3  Language- name 2 objects 2  Language- repeat 1  Language- follow 3 step command 3  Language- read & follow direction 1  Write a sentence 1  Copy design 1  Total score 30        Immunizations Immunization History  Administered Date(s) Administered   Fluad Quad(high Dose 65+) 02/29/2020, 06/12/2021, 06/18/2022   Influenza-Unspecified 05/04/2019   Pneumococcal Conjugate-13 11/22/2014   Pneumococcal  Polysaccharide-23 06/30/2012, 09/25/2019   Tdap 11/22/2014    {TDAP status:2101805}  {Flu Vaccine status:2101806}  {Pneumococcal vaccine status:2101807}  {Covid-19 vaccine status:2101808}  Qualifies for Shingles Vaccine? {YES/NO:21197}  Zostavax completed {YES/NO:21197}  {Shingrix Completed?:2101804}  Screening Tests Health Maintenance  Topic Date Due  Zoster Vaccines- Shingrix (1 of 2) Never done   INFLUENZA VACCINE  12/27/2022   Medicare Annual Wellness (AWV)  12/14/2023   DTaP/Tdap/Td (2 - Td or Tdap) 11/21/2024   Colonoscopy  04/13/2031   Pneumonia Vaccine 33+ Years old  Completed   Hepatitis C Screening  Completed   HPV VACCINES  Aged Out   COVID-19 Vaccine  Discontinued    Health Maintenance  Health Maintenance Due  Topic Date Due   Zoster Vaccines- Shingrix (1 of 2) Never done    {Colorectal cancer screening:2101809}  Lung Cancer Screening: (Low Dose CT Chest recommended if Age 28-80 years, 20 pack-year currently smoking OR have quit w/in 15years.) {DOES NOT does:27190::"does not"} qualify.   Lung Cancer Screening Referral: ***  Additional Screening:  Hepatitis C Screening: {DOES NOT does:27190::"does not"} qualify; Completed ***  Vision Screening: Recommended annual ophthalmology exams for early detection of glaucoma and other disorders of the eye. Is the patient up to date with their annual eye exam?  {YES/NO:21197} Who is the provider or what is the name of the office in which the patient attends annual eye exams? *** If pt is not established with a provider, would they like to be referred to a provider to establish care? {YES/NO:21197}.   Dental Screening: Recommended annual dental exams for proper oral hygiene  Diabetic Foot Exam: {Diabetic Foot Exam:2101802}  Community Resource Referral / Chronic Care Management: CRR required this visit?  {YES/NO:21197}  CCM required this visit?  {CCM Required [phone]}     Plan:     I have  personally reviewed and noted the following in the patient's chart:   Medical and social history Use of alcohol, tobacco or illicit drugs  Current medications and supplements including opioid prescriptions. {Opioid Prescriptions:4100838110} Functional ability and status Nutritional status Physical activity Advanced directives List of other physicians Hospitalizations, surgeries, and ER visits in previous 12 months Vitals Screenings to include cognitive, depression, and falls Referrals and appointments  In addition, I have reviewed and discussed with patient certain preventive protocols, quality metrics, and best practice recommendations. A written personalized care plan for preventive services as well as general preventive health recommendations were provided to patient.     Eugenie Norrie, CMA   12/13/2022   After Visit Summary: {CHL AMB AWV After Visit Summary:[phone]}  Nurse Notes: ***

## 2022-12-13 NOTE — Patient Instructions (Signed)
Health Maintenance, Male Adopting a healthy lifestyle and getting preventive care are important in promoting health and wellness. Ask your health care provider about: The right schedule for you to have regular tests and exams. Things you can do on your own to prevent diseases and keep yourself healthy. What should I know about diet, weight, and exercise? Eat a healthy diet  Eat a diet that includes plenty of vegetables, fruits, low-fat dairy products, and lean protein. Do not eat a lot of foods that are high in solid fats, added sugars, or sodium. Maintain a healthy weight Body mass index (BMI) is a measurement that can be used to identify possible weight problems. It estimates body fat based on height and weight. Your health care provider can help determine your BMI and help you achieve or maintain a healthy weight. Get regular exercise Get regular exercise. This is one of the most important things you can do for your health. Most adults should: Exercise for at least 150 minutes each week. The exercise should increase your heart rate and make you sweat (moderate-intensity exercise). Do strengthening exercises at least twice a week. This is in addition to the moderate-intensity exercise. Spend less time sitting. Even light physical activity can be beneficial. Watch cholesterol and blood lipids Have your blood tested for lipids and cholesterol at 72 years of age, then have this test every 5 years. You may need to have your cholesterol levels checked more often if: Your lipid or cholesterol levels are high. You are older than 72 years of age. You are at high risk for heart disease. What should I know about cancer screening? Many types of cancers can be detected early and may often be prevented. Depending on your health history and family history, you may need to have cancer screening at various ages. This may include screening for: Colorectal cancer. Prostate cancer. Skin cancer. Lung  cancer. What should I know about heart disease, diabetes, and high blood pressure? Blood pressure and heart disease High blood pressure causes heart disease and increases the risk of stroke. This is more likely to develop in people who have high blood pressure readings or are overweight. Talk with your health care provider about your target blood pressure readings. Have your blood pressure checked: Every 3-5 years if you are 18-39 years of age. Every year if you are 40 years old or older. If you are between the ages of 65 and 75 and are a current or former smoker, ask your health care provider if you should have a one-time screening for abdominal aortic aneurysm (AAA). Diabetes Have regular diabetes screenings. This checks your fasting blood sugar level. Have the screening done: Once every three years after age 45 if you are at a normal weight and have a low risk for diabetes. More often and at a younger age if you are overweight or have a high risk for diabetes. What should I know about preventing infection? Hepatitis B If you have a higher risk for hepatitis B, you should be screened for this virus. Talk with your health care provider to find out if you are at risk for hepatitis B infection. Hepatitis C Blood testing is recommended for: Everyone born from 1945 through 1965. Anyone with known risk factors for hepatitis C. Sexually transmitted infections (STIs) You should be screened each year for STIs, including gonorrhea and chlamydia, if: You are sexually active and are younger than 72 years of age. You are older than 72 years of age and your   health care provider tells you that you are at risk for this type of infection. Your sexual activity has changed since you were last screened, and you are at increased risk for chlamydia or gonorrhea. Ask your health care provider if you are at risk. Ask your health care provider about whether you are at high risk for HIV. Your health care provider  may recommend a prescription medicine to help prevent HIV infection. If you choose to take medicine to prevent HIV, you should first get tested for HIV. You should then be tested every 3 months for as long as you are taking the medicine. Follow these instructions at home: Alcohol use Do not drink alcohol if your health care provider tells you not to drink. If you drink alcohol: Limit how much you have to 0-2 drinks a day. Know how much alcohol is in your drink. In the [place] one drink equals one 12 oz bottle of beer (355 mL), one 5 oz glass of wine (148 mL), or one 1 oz glass of hard liquor (44 mL). Lifestyle Do not use any products that contain nicotine or tobacco. These products include cigarettes, chewing tobacco, and vaping devices, such as e-cigarettes. If you need help quitting, ask your health care provider. Do not use street drugs. Do not share needles. Ask your health care provider for help if you need support or information about quitting drugs. General instructions Schedule regular health, dental, and eye exams. Stay current with your vaccines. Tell your health care provider if: You often feel depressed. You have ever been abused or do not feel safe at home. Summary Adopting a healthy lifestyle and getting preventive care are important in promoting health and wellness. Follow your health care provider's instructions about healthy diet, exercising, and getting tested or screened for diseases. Follow your health care provider's instructions on monitoring your cholesterol and blood pressure. This information is not intended to replace advice given to you by your health care provider. Make sure you discuss any questions you have with your health care provider. Document Revised: 10/03/2020 Document Reviewed: 10/03/2020 Elsevier Patient Education  2024 [employer]  

## 2022-12-14 ENCOUNTER — Encounter: Payer: Self-pay | Admitting: Family Medicine

## 2022-12-14 ENCOUNTER — Ambulatory Visit (INDEPENDENT_AMBULATORY_CARE_PROVIDER_SITE_OTHER): Payer: Medicare Other | Admitting: Family Medicine

## 2022-12-14 VITALS — BP 124/80 | HR 67 | Temp 97.0°F | Resp 18 | Ht 73.0 in | Wt 226.0 lb

## 2022-12-14 DIAGNOSIS — Z125 Encounter for screening for malignant neoplasm of prostate: Secondary | ICD-10-CM

## 2022-12-14 DIAGNOSIS — R7301 Impaired fasting glucose: Secondary | ICD-10-CM | POA: Diagnosis not present

## 2022-12-14 DIAGNOSIS — Z0001 Encounter for general adult medical examination with abnormal findings: Secondary | ICD-10-CM | POA: Diagnosis not present

## 2022-12-14 DIAGNOSIS — E6609 Other obesity due to excess calories: Secondary | ICD-10-CM

## 2022-12-14 DIAGNOSIS — Z6830 Body mass index (BMI) 30.0-30.9, adult: Secondary | ICD-10-CM

## 2022-12-14 DIAGNOSIS — E782 Mixed hyperlipidemia: Secondary | ICD-10-CM

## 2022-12-14 DIAGNOSIS — Z0000 Encounter for general adult medical examination without abnormal findings: Secondary | ICD-10-CM

## 2022-12-14 DIAGNOSIS — B356 Tinea cruris: Secondary | ICD-10-CM | POA: Diagnosis not present

## 2022-12-14 DIAGNOSIS — E66811 Obesity, class 1: Secondary | ICD-10-CM

## 2022-12-14 MED ORDER — NYSTATIN 100000 UNIT/GM EX POWD
1.0000 | Freq: Two times a day (BID) | CUTANEOUS | 3 refills | Status: AC
Start: 2022-12-14 — End: ?

## 2022-12-14 NOTE — Progress Notes (Unsigned)
Subjective:   Joel Harding is a 72 y.o. male who presents for Medicare Annual/Subsequent preventive examination.  Visit Complete: In person  Patient Medicare AWV questionnaire was completed by the patient on 12/13/2022; I have confirmed that all information answered by patient is correct and no changes since this date.  Review of Systems  Constitutional:  Negative for chills, diaphoresis, fever and malaise/fatigue.  HENT:  Negative for congestion, ear pain and sore throat.   Respiratory:  Negative for cough, sputum production and wheezing.   Cardiovascular:  Negative for chest pain and palpitations.  Gastrointestinal:  Negative for abdominal pain, constipation, diarrhea, nausea and vomiting.  Genitourinary:  Negative for dysuria and urgency.  Musculoskeletal:  Negative for myalgias.  Neurological:  Negative for dizziness and headaches.  Psychiatric/Behavioral:  Negative for depression. The patient is not nervous/anxious.        Objective:    Today's Vitals   12/14/22 0817  BP: 124/80  Pulse: 67  Resp: 18  Temp: (!) 97 F (36.1 C)  SpO2: 97%  Weight: 226 lb (102.5 kg)  Height: 6\' 1"  (1.854 m)   Body mass index is 29.82 kg/m.  Physical Exam Vitals reviewed.  Constitutional:      Appearance: Normal appearance.  Neck:     Vascular: No carotid bruit.  Cardiovascular:     Rate and Rhythm: Normal rate and regular rhythm.     Heart sounds: Normal heart sounds.  Pulmonary:     Effort: Pulmonary effort is normal.     Breath sounds: Normal breath sounds. No wheezing, rhonchi or rales.  Abdominal:     General: Bowel sounds are normal.     Palpations: Abdomen is soft.     Tenderness: There is no abdominal tenderness.  Skin:    Findings: No rash.  Neurological:     Mental Status: He is alert.  Psychiatric:        Mood and Affect: Mood normal.        Behavior: Behavior normal.        12/01/2020    9:02 PM 09/25/2019    9:19 AM  Advanced Directives  Does Patient  Have a Medical Advance Directive? No No  Would patient like information on creating a medical advance directive?  Yes (MAU/Ambulatory/Procedural Areas - Information given)    Current Medications (verified) Outpatient Encounter Medications as of 12/14/2022  Medication Sig   nystatin powder Apply 1 Application topically 2 (two) times daily.   famciclovir (FAMVIR) 250 MG tablet TAKE 1 TABLET BY MOUTH TWICE DAILY   meloxicam (MOBIC) 7.5 MG tablet Take 7.5 mg by mouth daily.   Omega-3 Fatty Acids (FISH OIL) 1000 MG CAPS Take 1,000 mg by mouth 2 (two) times daily.   simvastatin (ZOCOR) 40 MG tablet TAKE 1 TABLET(40 MG) BY MOUTH DAILY   valsartan (DIOVAN) 160 MG tablet TAKE 1 TABLET(160 MG) BY MOUTH DAILY   No facility-administered encounter medications on file as of 12/14/2022.    Allergies (verified) Lisinopril   History: Past Medical History:  Diagnosis Date   Hypertension, essential 06/16/2008   Impaired fasting glucose 06/26/2010   Mixed hyperlipidemia 08/03/2008   Right nephrolithiasis    Past Surgical History:  Procedure Laterality Date   CYSTOSCOPY W/ URETERAL STENT PLACEMENT Right 10/28/2013   EYE SURGERY Left 11/2016   Cataract   EYE SURGERY Right 10/2016   Cataract   KNEE ARTHROSCOPY Left    LITHOTRIPSY     Family History  Problem Relation Age of  Onset   Cancer Mother    Heart attack Father    Social History   Socioeconomic History   Marital status: Married    Spouse name: Not on file   Number of children: Not on file   Years of education: Not on file   Highest education level: Not on file  Occupational History   Occupation: [employer] doctor  Tobacco Use   Smoking status: Former    Current packs/day: 0.00    Types: Cigarettes    Quit date: 06/05/1979    Years since quitting: 43.5   Smokeless tobacco: Never  Vaping Use   Vaping status: Never Used  Substance and Sexual Activity   Alcohol use: Never   Drug use: Never   Sexual activity: Not on file  Other Topics  Concern   Not on file  Social History Narrative   Not on file   Social Determinants of Health   Financial Resource Strain: Low Risk  (12/14/2022)   Overall Financial Resource Strain (CARDIA)    Difficulty of Paying Living Expenses: Not hard at all  Food Insecurity: No Food Insecurity (12/14/2022)   Hunger Vital Sign    Worried About Running Out of Food in the Last Year: Never true    Ran Out of Food in the Last Year: Never true  Transportation Needs: No Transportation Needs (12/14/2022)   PRAPARE - Administrator, Civil Service (Medical): No    Lack of Transportation (Non-Medical): No  Physical Activity: Inactive (12/14/2022)   Exercise Vital Sign    Days of Exercise per Week: 0 days    Minutes of Exercise per Session: 0 min  Stress: No Stress Concern Present (12/14/2022)   [employer] of Occupational Health - Occupational Stress Questionnaire    Feeling of Stress : Not at all  Social Connections: Moderately Integrated (12/11/2021)   Social Connection and Isolation Panel [NHANES]    Frequency of Communication with Friends and Family: More than three times a week    Frequency of Social Gatherings with Friends and Family: Once a week    Attends Religious Services: 1 to 4 times per year    Active Member of [employer] or Organizations: No    Attends Banker Meetings: Never    Marital Status: Married    Tobacco Counseling Counseling given: Not Answered   Clinical Intake:  Pre-visit preparation completed: Yes  Pain : No/denies pain     Nutritional Status: BMI 25 -29 Overweight Nutritional Risks: None Diabetes: No  How often do you need to have someone help you when you read instructions, pamphlets, or other written materials from your doctor or pharmacy?: 1 - Never  Interpreter Needed?: No      Activities of Daily Living     No data to display          Patient Care Team: Blane Ohara, MD as PCP - General (Family Medicine) Garnette Gunner, MD as Consulting Physician (Orthopedic Surgery) Webb Silversmith, MD (Gastroenterology)  Indicate any recent Medical Services you may have received from other than [facility] providers in the past year (date may be approximate).     Assessment:   This is a routine wellness examination for Joel Harding.  Hearing/Vision screen No results found.  Dietary issues and exercise activities discussed:     Goals Addressed   None   Depression Screen    12/14/2022    8:27 AM 12/11/2021    7:28 AM 06/12/2021    7:40 AM 11/21/2020  8:14 AM 11/02/2019    8:16 AM 09/25/2019    9:20 AM  PHQ 2/9 Scores  PHQ - 2 Score 0 0 0 0 0 0    Fall Risk    12/11/2021    7:27 AM 06/12/2021    7:40 AM 11/21/2020    8:14 AM 11/02/2019    8:16 AM 09/25/2019    8:38 AM  Fall Risk   Falls in the past year? 0 0 0 0 0  Number falls in past yr: 0 0 0 0   Injury with Fall? 0 0 0    Risk for fall due to : No Fall Risks  No Fall Risks    Follow up Falls evaluation completed Falls evaluation completed Falls evaluation completed      MEDICARE RISK AT HOME:   TIMED UP AND GO:  Was the test performed?  {[id number]}    Cognitive Function:    09/25/2019    9:21 AM  MMSE - Mini Mental State Exam  Orientation to time 5  Orientation to Place 5  Registration 3  Attention/ Calculation 5  Recall 3  Language- name 2 objects 2  Language- repeat 1  Language- follow 3 step command 3  Language- read & follow direction 1  Write a sentence 1  Copy design 1  Total score 30        Immunizations Immunization History  Administered Date(s) Administered   Fluad Quad(high Dose 65+) 02/29/2020, 06/12/2021, 06/18/2022   Influenza-Unspecified 05/04/2019   Pneumococcal Conjugate-13 11/22/2014   Pneumococcal Polysaccharide-23 06/30/2012, 09/25/2019   Tdap 11/22/2014   Immunization status: up to date and documented.  TDAP status: Up to date  Flu Vaccine status: Up to date  Pneumococcal vaccine status: Up to  date  Covid-19 vaccine status: Completed vaccines  Qualifies for Shingles Vaccine? Yes   Zostavax completed No   Shingrix Completed?: No.    Education has been provided regarding the importance of this vaccine. Patient has been advised to call insurance company to determine out of pocket expense if they have not yet received this vaccine. Advised may also receive vaccine at local pharmacy or Health Dept. Verbalized acceptance and understanding.  Screening Tests Health Maintenance  Topic Date Due   Zoster Vaccines- Shingrix (1 of 2) Never done   INFLUENZA VACCINE  12/27/2022   Medicare Annual Wellness (AWV)  12/15/2023   DTaP/Tdap/Td (2 - Td or Tdap) 11/21/2024   Colonoscopy  04/13/2031   Pneumonia Vaccine 76+ Years old  Completed   Hepatitis C Screening  Completed   HPV VACCINES  Aged Out   COVID-19 Vaccine  Discontinued    Health Maintenance  Health Maintenance Due  Topic Date Due   Zoster Vaccines- Shingrix (1 of 2) Never done    Colorectal cancer screening: Type of screening: Colonoscopy. Completed 2022. Repeat every 10 years  Lung Cancer Screening: (Low Dose CT Chest recommended if Age 86-80 years, 20 pack-year currently smoking OR have quit w/in 15years.) does not qualify.   Additional Screening:  Hepatitis C Screening: does not qualify; Completed 11/21/2020  Vision Screening: Recommended annual ophthalmology exams for early detection of glaucoma and other disorders of the eye. Is the patient up to date with their annual eye exam?  every other yrear  Dental Screening: Recommended annual dental exams for proper oral hygiene   Plan:     I have personally reviewed and noted the following in the patient's chart:   Medical and social history Use of alcohol, tobacco  or illicit drugs  Current medications and supplements including opioid prescriptions. Patient is not currently taking opioid prescriptions. Functional ability and status Nutritional status Physical  activity Advanced directives List of other physicians Hospitalizations, surgeries, and ER visits in previous 12 months Vitals Screenings to include cognitive, depression, and falls Referrals and appointments  In addition, I have reviewed and discussed with patient certain preventive protocols, quality metrics, and best practice recommendations. A written personalized care plan for preventive services as well as general preventive health recommendations were provided to patient.     Eugenie Norrie, CMA   12/15/2022   After Visit Summary: printed  Nurse Notes: none   I,Marla I Leal-Borjas,acting as a scribe for Blane Ohara, MD.,have documented all relevant documentation on the behalf of Blane Ohara, MD,as directed by  Blane Ohara, MD while in the presence of Blane Ohara, MD.

## 2022-12-15 DIAGNOSIS — B356 Tinea cruris: Secondary | ICD-10-CM | POA: Insufficient documentation

## 2022-12-15 DIAGNOSIS — Z0000 Encounter for general adult medical examination without abnormal findings: Secondary | ICD-10-CM | POA: Insufficient documentation

## 2022-12-15 DIAGNOSIS — E6609 Other obesity due to excess calories: Secondary | ICD-10-CM | POA: Insufficient documentation

## 2022-12-15 LAB — CBC WITH DIFFERENTIAL/PLATELET
Basophils Absolute: 0 10*3/uL (ref 0.0–0.2)
Basos: 1 %
EOS (ABSOLUTE): 0.2 10*3/uL (ref 0.0–0.4)
Eos: 3 %
Hematocrit: 44.6 % (ref 37.5–51.0)
Hemoglobin: 15.3 g/dL (ref 13.0–17.7)
Immature Grans (Abs): 0 10*3/uL (ref 0.0–0.1)
Immature Granulocytes: 0 %
Lymphocytes Absolute: 1.6 10*3/uL (ref 0.7–3.1)
Lymphs: 26 %
MCH: 31.4 pg (ref 26.6–33.0)
MCHC: 34.3 g/dL (ref 31.5–35.7)
MCV: 91 fL (ref 79–97)
Monocytes Absolute: 0.5 10*3/uL (ref 0.1–0.9)
Monocytes: 8 %
Neutrophils Absolute: 3.9 10*3/uL (ref 1.4–7.0)
Neutrophils: 62 %
Platelets: 177 10*3/uL (ref 150–450)
RBC: 4.88 x10E6/uL (ref 4.14–5.80)
RDW: 12.8 % (ref 11.6–15.4)
WBC: 6.2 10*3/uL (ref 3.4–10.8)

## 2022-12-15 LAB — PSA: Prostate Specific Ag, Serum: 0.9 ng/mL (ref 0.0–4.0)

## 2022-12-15 LAB — LIPID PANEL
Chol/HDL Ratio: 3 ratio (ref 0.0–5.0)
Cholesterol, Total: 130 mg/dL (ref 100–199)
HDL: 44 mg/dL (ref >39)
LDL Chol Calc (NIH): 71 mg/dL (ref 0–99)
Triglycerides: 73 mg/dL (ref 0–149)
VLDL Cholesterol Cal: 15 mg/dL (ref 5–40)

## 2022-12-15 LAB — HEMOGLOBIN A1C
Est. average glucose Bld gHb Est-mCnc: 123 mg/dL
Hgb A1c MFr Bld: 5.9 % — ABNORMAL HIGH (ref 4.8–5.6)

## 2022-12-15 LAB — COMPREHENSIVE METABOLIC PANEL
ALT: 14 IU/L (ref 0–44)
AST: 18 IU/L (ref 0–40)
Albumin: 4.2 g/dL (ref 3.8–4.8)
Alkaline Phosphatase: 89 IU/L (ref 44–121)
BUN/Creatinine Ratio: 20 (ref 10–24)
BUN: 24 mg/dL (ref 8–27)
Bilirubin Total: 0.4 mg/dL (ref 0.0–1.2)
CO2: 24 mmol/L (ref 20–29)
Calcium: 9.4 mg/dL (ref 8.6–10.2)
Chloride: 107 mmol/L — ABNORMAL HIGH (ref 96–106)
Creatinine, Ser: 1.23 mg/dL (ref 0.76–1.27)
Globulin, Total: 2 g/dL (ref 1.5–4.5)
Glucose: 101 mg/dL — ABNORMAL HIGH (ref 70–99)
Potassium: 5.2 mmol/L (ref 3.5–5.2)
Sodium: 145 mmol/L — ABNORMAL HIGH (ref 134–144)
Total Protein: 6.2 g/dL (ref 6.0–8.5)
eGFR: 62 mL/min/{1.73_m2} (ref >59)

## 2022-12-15 LAB — TSH: TSH: 1.73 u[IU]/mL (ref 0.450–4.500)

## 2022-12-15 NOTE — Assessment & Plan Note (Signed)
Recommend continue to work on eating healthy diet and exercise.  

## 2022-12-15 NOTE — Assessment & Plan Note (Signed)
Well controlled.  ?No changes to medicines.  ?Continue to work on eating a healthy diet and exercise.  ?Labs drawn today.  ?

## 2022-12-15 NOTE — Assessment & Plan Note (Signed)
Sent nystatin powder.

## 2022-12-15 NOTE — Assessment & Plan Note (Signed)

## 2022-12-16 ENCOUNTER — Encounter: Payer: Self-pay | Admitting: Family Medicine

## 2023-02-11 DIAGNOSIS — K080 Exfoliation of teeth due to systemic causes: Secondary | ICD-10-CM | POA: Diagnosis not present

## 2023-04-11 ENCOUNTER — Other Ambulatory Visit: Payer: Self-pay | Admitting: Family Medicine

## 2023-04-20 ENCOUNTER — Other Ambulatory Visit: Payer: Self-pay | Admitting: Family Medicine

## 2023-06-20 NOTE — Progress Notes (Unsigned)
Subjective:  Patient ID: Joel Harding, male    DOB: [birth year]  Age: 73 y.o. MRN: [id number]  Chief Complaint  Patient presents with   Medical Management of Chronic Issues    HPI History of Present Illness The patient presents with right wrist pain that started about a week before Thanksgiving. He initially noticed two small red spots on his thumb and swelling, which he attributed to a bite. Over the course of a week, the pain and swelling moved up past his wrist and then settled back down into his wrist. The pain is localized to the radiocarpal joint and is described as intense, like 'somebody jamming something.' The pain occasionally radiates up his arm. Over-the-counter pain medications like Tylenol and Advil provide some relief. The patient denies any recent illness, chest pain, breathing problems, abdominal pain, back pain, dizziness, or headaches. He is currently taking simvastatin, fish oil, and valsartan for cholesterol and blood pressure management, respectively, and Famvir. He maintains an active lifestyle, including walking and running.     06/21/2023    7:23 AM 12/14/2022    8:27 AM 12/11/2021    7:28 AM 06/12/2021    7:40 AM 11/21/2020    8:14 AM  Depression screen PHQ 2/9  Decreased Interest 0 0 0 0 0  Down, Depressed, Hopeless 0 0 0 0 0  PHQ - 2 Score 0 0 0 0 0        06/21/2023    7:23 AM  Fall Risk   Falls in the past year? 0  Number falls in past yr: 0  Injury with Fall? 0  Risk for fall due to : No Fall Risks    Patient Care Team: Blane Ohara, MD as PCP - General (Family Medicine) Garnette Gunner, MD as Consulting Physician (Orthopedic Surgery) Webb Silversmith, MD (Gastroenterology)   Review of Systems  Constitutional:  Negative for chills, fatigue and fever.  HENT:  Negative for congestion, ear pain, sinus pressure and sore throat.   Respiratory:  Negative for cough.   Cardiovascular:  Negative for chest pain.  Gastrointestinal:  Negative for abdominal  pain, constipation, diarrhea, nausea and vomiting.  Genitourinary:  Negative for dysuria and frequency.  Musculoskeletal:  Negative for arthralgias, back pain and myalgias.       Waist pain, started a week before thanksgiving.  Neurological:  Negative for dizziness and headaches.  Psychiatric/Behavioral:  Negative for dysphoric mood. The patient is not nervous/anxious.     Current Outpatient Medications on File Prior to Visit  Medication Sig Dispense Refill   famciclovir (FAMVIR) 250 MG tablet TAKE 1 TABLET BY MOUTH TWICE DAILY 180 tablet 0   meloxicam (MOBIC) 7.5 MG tablet Take 7.5 mg by mouth daily.     nystatin powder Apply 1 Application topically 2 (two) times daily. 60 g 3   Omega-3 Fatty Acids (FISH OIL) 1000 MG CAPS Take 1,000 mg by mouth 2 (two) times daily.     simvastatin (ZOCOR) 40 MG tablet TAKE 1 TABLET(40 MG) BY MOUTH DAILY 90 tablet 0   valsartan (DIOVAN) 160 MG tablet TAKE 1 TABLET(160 MG) BY MOUTH DAILY 90 tablet 1   No current facility-administered medications on file prior to visit.   Past Medical History:  Diagnosis Date   Hypertension, essential 06/16/2008   Impaired fasting glucose 06/26/2010   Mixed hyperlipidemia 08/03/2008   Right nephrolithiasis    Past Surgical History:  Procedure Laterality Date   CYSTOSCOPY W/ URETERAL STENT PLACEMENT Right 10/28/2013   EYE  SURGERY Left 11/2016   Cataract   EYE SURGERY Right 10/2016   Cataract   KNEE ARTHROSCOPY Left    LITHOTRIPSY      Family History  Problem Relation Age of Onset   Cancer Mother    Heart attack Father    Social History   Socioeconomic History   Marital status: Married    Spouse name: Not on file   Number of children: Not on file   Years of education: Not on file   Highest education level: Not on file  Occupational History   Occupation: [employer] doctor  Tobacco Use   Smoking status: Former    Current packs/day: 0.00    Types: Cigarettes    Quit date: 06/05/1979    Years since quitting: 44.0    Smokeless tobacco: Never  Vaping Use   Vaping status: Never Used  Substance and Sexual Activity   Alcohol use: Never   Drug use: Never   Sexual activity: Not on file  Other Topics Concern   Not on file  Social History Narrative   Not on file   Social Drivers of Health   Financial Resource Strain: Low Risk  (12/14/2022)   Overall Financial Resource Strain (CARDIA)    Difficulty of Paying Living Expenses: Not hard at all  Food Insecurity: No Food Insecurity (12/14/2022)   Hunger Vital Sign    Worried About Running Out of Food in the Last Year: Never true    Ran Out of Food in the Last Year: Never true  Transportation Needs: No Transportation Needs (12/14/2022)   PRAPARE - Administrator, Civil Service (Medical): No    Lack of Transportation (Non-Medical): No  Physical Activity: Inactive (12/14/2022)   Exercise Vital Sign    Days of Exercise per Week: 0 days    Minutes of Exercise per Session: 0 min  Stress: No Stress Concern Present (12/14/2022)   [employer] of Occupational Health - Occupational Stress Questionnaire    Feeling of Stress : Not at all  Social Connections: Moderately Integrated (12/11/2021)   Social Connection and Isolation Panel [NHANES]    Frequency of Communication with Friends and Family: More than three times a week    Frequency of Social Gatherings with Friends and Family: Once a week    Attends Religious Services: 1 to 4 times per year    Active Member of [employer] or Organizations: No    Attends Engineer, structural: Never    Marital Status: Married    Objective:  BP 132/80   Pulse 80   Temp 98.5 F (36.9 C)   Ht 6\' 1"  (1.854 m)   Wt 237 lb 9.6 oz (107.8 kg)   SpO2 98%   BMI 31.35 kg/m      06/22/2023   12:30 PM 06/22/2023   12:28 PM 06/21/2023    7:23 AM  BP/Weight  Systolic BP 138  161  Diastolic BP 90  80  Wt. (Lbs)  230 237.6  BMI  30.34 kg/m2 31.35 kg/m2    Physical Exam Vitals reviewed.  Constitutional:       Appearance: Normal appearance.  Neck:     Vascular: No carotid bruit.  Cardiovascular:     Rate and Rhythm: Normal rate and regular rhythm.     Heart sounds: Normal heart sounds.  Pulmonary:     Effort: Pulmonary effort is normal.     Breath sounds: Normal breath sounds. No wheezing, rhonchi or rales.  Abdominal:  General: Bowel sounds are normal.     Palpations: Abdomen is soft.     Tenderness: There is no abdominal tenderness.  Musculoskeletal:        General: Tenderness (right wrist over radioulnar joint. no edema. no erythema. Flexion of right wrist causes discomfort.) present.  Neurological:     Mental Status: He is alert and oriented to person, place, and time.  Psychiatric:        Mood and Affect: Mood normal.        Behavior: Behavior normal.    Joint Injection/Arthrocentesis  Date/Time: 06/23/2023 4:39 PM  Performed by: Blane Ohara, MD Authorized by: Blane Ohara, MD  Indications: pain  Body area: wrist Joint: right wrist Local anesthesia used: yes  Anesthesia: Local anesthesia used: yes Local Anesthetic: topical anesthetic  Sedation: Patient sedated: no  Needle size: 22 G Ultrasound guidance: no Approach: posterior Triamcinolone amount: 20 mg Lidocaine 1% amount: 2 mL Patient tolerance: patient tolerated the procedure well with no immediate complications      Diabetic Foot Exam - Simple   No data filed      Lab Results  Component Value Date   WBC 6.8 06/21/2023   HGB 16.3 06/21/2023   HCT 48.0 06/21/2023   PLT 182 06/21/2023   GLUCOSE 102 (H) 06/21/2023   CHOL 168 06/21/2023   TRIG 116 06/21/2023   HDL 49 06/21/2023   LDLCALC 98 06/21/2023   ALT 19 06/21/2023   AST 21 06/21/2023   NA 140 06/21/2023   K 6.2 (HH) 06/21/2023   CL 103 06/21/2023   CREATININE 1.31 (H) 06/21/2023   BUN 20 06/21/2023   CO2 23 06/21/2023   TSH 1.730 12/14/2022   HGBA1C 5.9 (H) 06/21/2023   MICROALBUR 150 11/21/2020      Assessment & Plan:     Mixed hyperlipidemia Assessment & Plan: Well controlled.  No changes to medicines. Simvastatin 40mg  1 tablet once daily, Fish Oil 1,000mg  2 capsules BID Continue to work on eating a healthy diet and exercise.  Labs drawn today.    Orders: -     Lipid panel  Impaired fasting glucose Assessment & Plan: Recommend continue to work on eating healthy diet and exercise.   Orders: -     Hemoglobin A1c  Hypertension, essential Assessment & Plan: Well controlled.  No changes to medicines. Valsartan 160mg  1 tablet daily Continue to work on eating a healthy diet and exercise.  Labs drawn today.    Orders: -     CBC with Differential/Platelet -     Comprehensive metabolic panel  Encounter for immunization -     Flu Vaccine Trivalent High Dose (Fluad)  Right wrist pain -     Arthrocentesis    No orders of the defined types were placed in this encounter.   Orders Placed This Encounter  Procedures   Joint Injection/Arthrocentesis   Flu Vaccine Trivalent High Dose (Fluad)   CBC with Differential/Platelet   Comprehensive metabolic panel   Hemoglobin A1c   Lipid panel     Follow-up: Return in about 6 months (around 12/19/2023) for cpe, chronic follow up.   I,Marla I Leal-Borjas,acting as a scribe for Blane Ohara, MD.,have documented all relevant documentation on the behalf of Blane Ohara, MD,as directed by  Blane Ohara, MD while in the presence of Blane Ohara, MD.   An After Visit Summary was printed and given to the patient.  I attest that I have reviewed this visit and agree with the plan  scribed by my staff.   Blane Ohara, MD [facility] [phone]

## 2023-06-21 ENCOUNTER — Ambulatory Visit: Payer: Medicare Other | Admitting: Family Medicine

## 2023-06-21 ENCOUNTER — Telehealth: Payer: Self-pay | Admitting: Family Medicine

## 2023-06-21 ENCOUNTER — Encounter: Payer: Self-pay | Admitting: Family Medicine

## 2023-06-21 VITALS — BP 132/80 | HR 80 | Temp 98.5°F | Ht 73.0 in | Wt 237.6 lb

## 2023-06-21 DIAGNOSIS — Z23 Encounter for immunization: Secondary | ICD-10-CM | POA: Diagnosis not present

## 2023-06-21 DIAGNOSIS — M25531 Pain in right wrist: Secondary | ICD-10-CM

## 2023-06-21 DIAGNOSIS — E782 Mixed hyperlipidemia: Secondary | ICD-10-CM

## 2023-06-21 DIAGNOSIS — R7301 Impaired fasting glucose: Secondary | ICD-10-CM

## 2023-06-21 DIAGNOSIS — I10 Essential (primary) hypertension: Secondary | ICD-10-CM | POA: Diagnosis not present

## 2023-06-21 NOTE — Telephone Encounter (Signed)
[facility] called with a critical potassium of 6.2. Tried to call patient and recording stated that he is "not accepting phone calls from this number." I called PCP and made her aware as well.

## 2023-06-22 ENCOUNTER — Other Ambulatory Visit: Payer: Self-pay | Admitting: Family Medicine

## 2023-06-22 ENCOUNTER — Ambulatory Visit (HOSPITAL_BASED_OUTPATIENT_CLINIC_OR_DEPARTMENT_OTHER)
Admission: EM | Admit: 2023-06-22 | Discharge: 2023-06-22 | Disposition: A | Discharge status: Home / Self Care | Payer: Medicare Other

## 2023-06-22 ENCOUNTER — Encounter: Payer: Self-pay | Admitting: Family Medicine

## 2023-06-22 ENCOUNTER — Encounter (HOSPITAL_BASED_OUTPATIENT_CLINIC_OR_DEPARTMENT_OTHER): Payer: Self-pay

## 2023-06-22 ENCOUNTER — Telehealth: Payer: Self-pay | Admitting: Family Medicine

## 2023-06-22 DIAGNOSIS — E875 Hyperkalemia: Secondary | ICD-10-CM

## 2023-06-22 LAB — CBC WITH DIFFERENTIAL/PLATELET
Basophils Absolute: 0 10*3/uL (ref 0.0–0.2)
Basos: 0 %
EOS (ABSOLUTE): 0.2 10*3/uL (ref 0.0–0.4)
Eos: 2 %
Hematocrit: 48 % (ref 37.5–51.0)
Hemoglobin: 16.3 g/dL (ref 13.0–17.7)
Immature Grans (Abs): 0 10*3/uL (ref 0.0–0.1)
Immature Granulocytes: 0 %
Lymphocytes Absolute: 1.7 10*3/uL (ref 0.7–3.1)
Lymphs: 26 %
MCH: 31.6 pg (ref 26.6–33.0)
MCHC: 34 g/dL (ref 31.5–35.7)
MCV: 93 fL (ref 79–97)
Monocytes Absolute: 0.5 10*3/uL (ref 0.1–0.9)
Monocytes: 7 %
Neutrophils Absolute: 4.4 10*3/uL (ref 1.4–7.0)
Neutrophils: 65 %
Platelets: 182 10*3/uL (ref 150–450)
RBC: 5.16 x10E6/uL (ref 4.14–5.80)
RDW: 13.1 % (ref 11.6–15.4)
WBC: 6.8 10*3/uL (ref 3.4–10.8)

## 2023-06-22 LAB — COMPREHENSIVE METABOLIC PANEL
ALT: 19 [IU]/L (ref 0–44)
AST: 21 [IU]/L (ref 0–40)
Albumin: 4.5 g/dL (ref 3.8–4.8)
Alkaline Phosphatase: 112 [IU]/L (ref 44–121)
BUN/Creatinine Ratio: 15 (ref 10–24)
BUN: 20 mg/dL (ref 8–27)
Bilirubin Total: 0.5 mg/dL (ref 0.0–1.2)
CO2: 23 mmol/L (ref 20–29)
Calcium: 9.5 mg/dL (ref 8.6–10.2)
Chloride: 103 mmol/L (ref 96–106)
Creatinine, Ser: 1.31 mg/dL — ABNORMAL HIGH (ref 0.76–1.27)
Globulin, Total: 2.1 g/dL (ref 1.5–4.5)
Glucose: 102 mg/dL — ABNORMAL HIGH (ref 70–99)
Potassium: 6.2 mmol/L (ref 3.5–5.2)
Sodium: 140 mmol/L (ref 134–144)
Total Protein: 6.6 g/dL (ref 6.0–8.5)
eGFR: 58 mL/min/{1.73_m2} — ABNORMAL LOW (ref >59)

## 2023-06-22 LAB — HEMOGLOBIN A1C
Est. average glucose Bld gHb Est-mCnc: 123 mg/dL
Hgb A1c MFr Bld: 5.9 % — ABNORMAL HIGH (ref 4.8–5.6)

## 2023-06-22 LAB — LIPID PANEL
Chol/HDL Ratio: 3.4 {ratio} (ref 0.0–5.0)
Cholesterol, Total: 168 mg/dL (ref 100–199)
HDL: 49 mg/dL (ref >39)
LDL Chol Calc (NIH): 98 mg/dL (ref 0–99)
Triglycerides: 116 mg/dL (ref 0–149)
VLDL Cholesterol Cal: 21 mg/dL (ref 5–40)

## 2023-06-22 NOTE — ED Triage Notes (Signed)
Pt states that his pcp Dr. Sedalia Muta told him to come to clinic for blood work. Pt states that he was told his Potassium is too high.

## 2023-06-22 NOTE — ED Provider Notes (Signed)
[facility]    CSN: [id number] Arrival date & time: 06/22/23  1159      History   Chief Complaint Chief Complaint  Patient presents with   abnormal blood work    HPI Joel Harding is a 73 y.o. male.   HPI  Past Medical History:  Diagnosis Date   Hypertension, essential 06/16/2008   Impaired fasting glucose 06/26/2010   Mixed hyperlipidemia 08/03/2008   Right nephrolithiasis     Patient Active Problem List   Diagnosis Date Noted   Encounter for Medicare annual wellness exam 12/15/2022   Jock itch 12/15/2022   Class 1 obesity due to excess calories with serious comorbidity and body mass index (BMI) of 30.0 to 30.9 in adult 12/15/2022   Left renal mass 06/18/2022   General medical exam 12/11/2021   Bilateral finger numbness 06/12/2021   Genital herpes simplex 05/19/2020   Benign prostatic hyperplasia without lower urinary tract symptoms 11/02/2019   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 Authorizing Provider  famciclovir (FAMVIR) 250 MG tablet TAKE 1 TABLET BY MOUTH TWICE DAILY 04/21/23  Yes Sirivol, Mamatha, MD  meloxicam (MOBIC) 7.5 MG tablet Take 7.5 mg by mouth daily.   Yes [provider]  nystatin powder Apply 1 Application topically 2 (two) times daily. 12/14/22  Yes Cox, Kirsten, MD  Omega-3 Fatty Acids (FISH OIL) 1000 MG CAPS Take 1,000 mg by mouth 2 (two) times daily. 10/27/13  Yes [provider]  simvastatin (ZOCOR) 40 MG tablet TAKE 1 TABLET(40 MG) BY MOUTH DAILY 04/11/23  Yes Cox, Kirsten, MD  valsartan (DIOVAN) 160 MG tablet TAKE 1 TABLET(160 MG) BY MOUTH DAILY 08/16/22  Yes Cox, Kirsten, MD    Family History Family History  Problem Relation Age of Onset   Cancer Mother    Heart attack Father     Social History Social History   Tobacco Use   Smoking status: Former    Current packs/day: 0.00    Types: Cigarettes    Quit date: 06/05/1979    Years since quitting: 44.0   Smokeless tobacco: Never  Vaping Use   Vaping status: Never Used  Substance Use Topics   Alcohol use: Never   Drug use: Never     Allergies   Lisinopril   Review of Systems Review of Systems   Physical Exam Triage Vital Signs ED Triage Vitals  Encounter Vitals Group     BP 06/22/23 1230 (!) 138/90     Systolic BP Percentile --      Diastolic BP Percentile --      Pulse Rate 06/22/23 1230 67     Resp 06/22/23 1230 16     Temp 06/22/23 1230 98.4 F (36.9 C)     Temp Source 06/22/23 1230 Oral     SpO2 06/22/23 1230 94 %     Weight 06/22/23 1228 230 lb (104.3 kg)  Height 06/22/23 1228 6\' 1"  (1.854 m)     Head Circumference --      Peak Flow --      Pain Score 06/22/23 1228 0     Pain Loc --      Pain Education --      Exclude from Growth Chart --    No data found.  Updated Vital Signs BP (!) 138/90 (BP Location: Right Arm)   Pulse 67   Temp 98.4 F (36.9 C) (Oral)   Resp 16   Ht 6\' 1"  (1.854 m)   Wt 230 lb (104.3 kg)   SpO2 94%   BMI 30.34 kg/m   Visual Acuity Right Eye Distance:   Left Eye Distance:   Bilateral Distance:    Right Eye Near:   Left Eye Near:    Bilateral Near:     Physical Exam   UC Treatments / Results  Labs (all labs ordered are listed, but only abnormal results are displayed) Labs Reviewed - No data to display  EKG   Radiology No results found.  Procedures Procedures (including critical care time)  Medications  Ordered in UC Medications - No data to display  Initial Impression / Assessment and Plan / UC Course  I have reviewed the triage vital signs and the nursing notes.  Pertinent labs & imaging results that were available during my care of the patient were reviewed by me and considered in my medical decision making (see chart for details).  Not seen - sent for a follow-up lab by his PCP that we could not manage.  Visit canceled.  Advised to contact his PCP.  Advised his potassium was very high (6.2) and he should get a recheck.  Final Clinical Impressions(s) / UC Diagnoses   Final diagnoses:  None   Discharge Instructions   None    ED Prescriptions   None    PDMP not reviewed this encounter.   Prescilla Sours, FNP 06/22/23 1321

## 2023-06-22 NOTE — Assessment & Plan Note (Signed)
Well controlled.  No changes to medicines. Simvastatin 40mg  1 tablet once daily, Fish Oil 1,000mg  2 capsules BID Continue to work on eating a healthy diet and exercise.  Labs drawn today.

## 2023-06-22 NOTE — Assessment & Plan Note (Signed)
Recommend continue to work on eating healthy diet and exercise.

## 2023-06-22 NOTE — Telephone Encounter (Signed)
Patient called this afternoon at 4:06 PM.  He stated that he talked to Dr. Sedalia Muta and she told him to go to the urgent care to have labs redrawn  since his potassium was 6.2.  I explained to him he had a critical potassium level that could cause serious side effects that could be life-threatening.  I advised him to go to the hospital to have labs drawn since the urgent care said that those labs would not be ready for 48 hours.  Patient denies any symptoms and refuses to go to the hospital.

## 2023-06-22 NOTE — Assessment & Plan Note (Signed)
Well controlled.  No changes to medicines. Valsartan 160mg  1 tablet daily Continue to work on eating a healthy diet and exercise.  Labs drawn today.

## 2023-06-23 DIAGNOSIS — Z23 Encounter for immunization: Secondary | ICD-10-CM | POA: Insufficient documentation

## 2023-06-23 DIAGNOSIS — M25531 Pain in right wrist: Secondary | ICD-10-CM | POA: Insufficient documentation

## 2023-06-24 ENCOUNTER — Other Ambulatory Visit: Payer: Self-pay

## 2023-06-24 ENCOUNTER — Telehealth: Payer: Self-pay

## 2023-06-24 DIAGNOSIS — E875 Hyperkalemia: Secondary | ICD-10-CM

## 2023-06-24 NOTE — Telephone Encounter (Signed)
Patient states he is unable to come into the office today for STAT labs due to being over the road and is unsure what time he will be home today. States he will come in the morning for labs. Denies any Chest pain/SHOB/nausea/vomiting.     Copied from CRM [id number]. Topic: Clinical - Request for Lab/Test Order >> Jun 24, 2023 10:20 AM Shelah Lewandowsky wrote: Reason for CRM: Patient states he was  not able to get the labs done, asking about a prescription, please call [phone]

## 2023-06-25 ENCOUNTER — Other Ambulatory Visit: Payer: Medicare Other

## 2023-06-25 ENCOUNTER — Encounter: Payer: Self-pay | Admitting: Family Medicine

## 2023-06-25 DIAGNOSIS — E875 Hyperkalemia: Secondary | ICD-10-CM | POA: Diagnosis not present

## 2023-06-25 LAB — COMPREHENSIVE METABOLIC PANEL
ALT: 21 [IU]/L (ref 0–44)
AST: 21 [IU]/L (ref 0–40)
Albumin: 4.1 g/dL (ref 3.8–4.8)
Alkaline Phosphatase: 115 [IU]/L (ref 44–121)
BUN/Creatinine Ratio: 16 (ref 10–24)
BUN: 21 mg/dL (ref 8–27)
Bilirubin Total: 0.3 mg/dL (ref 0.0–1.2)
CO2: 25 mmol/L (ref 20–29)
Calcium: 9.2 mg/dL (ref 8.6–10.2)
Chloride: 102 mmol/L (ref 96–106)
Creatinine, Ser: 1.31 mg/dL — ABNORMAL HIGH (ref 0.76–1.27)
Globulin, Total: 2.5 g/dL (ref 1.5–4.5)
Glucose: 114 mg/dL — ABNORMAL HIGH (ref 70–99)
Potassium: 5.1 mmol/L (ref 3.5–5.2)
Sodium: 141 mmol/L (ref 134–144)
Total Protein: 6.6 g/dL (ref 6.0–8.5)
eGFR: 58 mL/min/{1.73_m2} — ABNORMAL LOW (ref >59)

## 2023-06-28 ENCOUNTER — Telehealth: Payer: Self-pay

## 2023-06-28 NOTE — Telephone Encounter (Signed)
Copied from CRM [id number]. Topic: Clinical - Lab/Test Results >> Jun 28, 2023 12:17 PM Fuller Mandril wrote: Reason for CRM: Patient called to check status of lab results. Read note as written by provider and advised patient can access on MyChart. Patient did not have any further question. Thank You

## 2023-07-07 ENCOUNTER — Other Ambulatory Visit: Payer: Self-pay | Admitting: Family Medicine

## 2023-07-07 ENCOUNTER — Other Ambulatory Visit: Payer: Self-pay

## 2023-10-01 DIAGNOSIS — K080 Exfoliation of teeth due to systemic causes: Secondary | ICD-10-CM | POA: Diagnosis not present

## 2023-12-17 ENCOUNTER — Encounter: Payer: Self-pay | Admitting: Family Medicine

## 2023-12-17 ENCOUNTER — Ambulatory Visit (INDEPENDENT_AMBULATORY_CARE_PROVIDER_SITE_OTHER): Admitting: Family Medicine

## 2023-12-17 VITALS — BP 106/68 | HR 76 | Temp 98.4°F | Resp 18 | Ht 73.0 in | Wt 242.0 lb

## 2023-12-17 DIAGNOSIS — E782 Mixed hyperlipidemia: Secondary | ICD-10-CM | POA: Diagnosis not present

## 2023-12-17 DIAGNOSIS — E875 Hyperkalemia: Secondary | ICD-10-CM | POA: Insufficient documentation

## 2023-12-17 DIAGNOSIS — I10 Essential (primary) hypertension: Secondary | ICD-10-CM

## 2023-12-17 DIAGNOSIS — Z0000 Encounter for general adult medical examination without abnormal findings: Secondary | ICD-10-CM | POA: Diagnosis not present

## 2023-12-17 DIAGNOSIS — R7301 Impaired fasting glucose: Secondary | ICD-10-CM | POA: Diagnosis not present

## 2023-12-17 LAB — LIPID PANEL
Chol/HDL Ratio: 3.8 ratio (ref 0.0–5.0)
Cholesterol, Total: 155 mg/dL (ref 100–199)
HDL: 41 mg/dL (ref >39)
LDL Chol Calc (NIH): 92 mg/dL (ref 0–99)
Triglycerides: 125 mg/dL (ref 0–149)
VLDL Cholesterol Cal: 22 mg/dL (ref 5–40)

## 2023-12-17 LAB — COMPREHENSIVE METABOLIC PANEL WITH GFR
ALT: 15 IU/L (ref 0–44)
AST: 17 IU/L (ref 0–40)
Albumin: 4.3 g/dL (ref 3.8–4.8)
Alkaline Phosphatase: 101 IU/L (ref 44–121)
BUN/Creatinine Ratio: 14 (ref 10–24)
BUN: 20 mg/dL (ref 8–27)
Bilirubin Total: 0.5 mg/dL (ref 0.0–1.2)
CO2: 22 mmol/L (ref 20–29)
Calcium: 9.3 mg/dL (ref 8.6–10.2)
Chloride: 103 mmol/L (ref 96–106)
Creatinine, Ser: 1.44 mg/dL — ABNORMAL HIGH (ref 0.76–1.27)
Globulin, Total: 2.2 g/dL (ref 1.5–4.5)
Glucose: 122 mg/dL — ABNORMAL HIGH (ref 70–99)
Potassium: 4.9 mmol/L (ref 3.5–5.2)
Sodium: 139 mmol/L (ref 134–144)
Total Protein: 6.5 g/dL (ref 6.0–8.5)
eGFR: 51 mL/min/1.73 — ABNORMAL LOW (ref >59)

## 2023-12-17 LAB — HEMOGLOBIN A1C
Est. average glucose Bld gHb Est-mCnc: 134 mg/dL
Hgb A1c MFr Bld: 6.3 % — ABNORMAL HIGH (ref 4.8–5.6)

## 2023-12-17 LAB — CBC WITH DIFFERENTIAL/PLATELET
Basophils Absolute: 0 x10E3/uL (ref 0.0–0.2)
Basos: 0 %
EOS (ABSOLUTE): 0.1 x10E3/uL (ref 0.0–0.4)
Eos: 2 %
Hematocrit: 45.8 % (ref 37.5–51.0)
Hemoglobin: 15.6 g/dL (ref 13.0–17.7)
Immature Grans (Abs): 0 x10E3/uL (ref 0.0–0.1)
Immature Granulocytes: 0 %
Lymphocytes Absolute: 1.6 x10E3/uL (ref 0.7–3.1)
Lymphs: 22 %
MCH: 32 pg (ref 26.6–33.0)
MCHC: 34.1 g/dL (ref 31.5–35.7)
MCV: 94 fL (ref 79–97)
Monocytes Absolute: 0.5 x10E3/uL (ref 0.1–0.9)
Monocytes: 7 %
Neutrophils Absolute: 4.9 x10E3/uL (ref 1.4–7.0)
Neutrophils: 69 %
Platelets: 186 x10E3/uL (ref 150–450)
RBC: 4.87 x10E6/uL (ref 4.14–5.80)
RDW: 13.2 % (ref 11.6–15.4)
WBC: 7.1 x10E3/uL (ref 3.4–10.8)

## 2023-12-17 NOTE — Assessment & Plan Note (Signed)
 Resolved Lab Results  Component Value Date   K 5.1 06/25/2023  - Labs drawn today

## 2023-12-17 NOTE — Assessment & Plan Note (Signed)
 Weight Management He gained weight to 240 pounds from 221 pounds. Aims to lose 20 pounds for health improvement. - Encouraged dietary modifications, reduce ice cream.  General Health Maintenance Up to date with screenings. Requires shingles vaccination. - Administer shingles vaccination at pharmacy. - Perform lab work today for review at Friday's appointment with Doctor Cox.      Things to do to keep yourself healthy  - Exercise at least 30-45 minutes a day, 3-4 days a week.  - Eat a low-fat diet with lots of fruits and vegetables, up to 7-9 servings per day.  - Seatbelts can save your life. Wear them always.  - Smoke detectors on every level of your home, check batteries every year.  - Eye Doctor - have an eye exam every 1-2 years  - Alcohol -  If you drink, do it moderately, less than 2 drinks per day.  - Health Care Power of Attorney. Choose someone to speak for you if you are not able.  - Depression is common in our stressful world.If you're feeling down or losing interest in things you normally enjoy, please come in for a visit.  - Violence - If anyone is threatening or hurting you, please call immediately.

## 2023-12-17 NOTE — Assessment & Plan Note (Signed)
 Well controlled.  BP Readings from Last 3 Encounters:  12/17/23 106/68  06/22/23 (!) 138/90  06/21/23 132/80    No changes to medicines. Valsartan  160mg  1 tablet daily Continue to work on eating a healthy diet and exercise.  Labs drawn today.

## 2023-12-17 NOTE — Progress Notes (Signed)
 Subjective:   Joel Harding is a 73 y.o. male who presents for Medicare Annual/Subsequent preventive examination.  Visit Complete: In person  Patient Medicare AWV questionnaire was completed by the patient on 12/17/2023; I have confirmed that all information answered by patient is correct and no changes since this date.  Cardiac Risk Factors include: advanced age (>68men, >2 women);hypertension;male gender;dyslipidemia     Objective:    Today's Vitals   12/17/23 0748 12/17/23 0751  BP: 106/68   Pulse: 76   Resp: 18   Temp: 98.4 F (36.9 C)   TempSrc: Temporal   SpO2: 98%   Weight: 242 lb (109.8 kg)   Height: 6' 1 (1.854 m)   PainSc: 0-No pain 0-No pain   Body mass index is 31.93 kg/m.     12/01/2020    9:02 PM 09/25/2019    9:19 AM  Advanced Directives  Does Patient Have a Medical Advance Directive? No No  Would patient like information on creating a medical advance directive?  Yes (MAU/Ambulatory/Procedural Areas - Information given)    Current Medications (verified) Outpatient Encounter Medications as of 12/17/2023  Medication Sig   famciclovir  (FAMVIR ) 250 MG tablet TAKE 1 TABLET BY MOUTH TWICE DAILY   nystatin  powder Apply 1 Application topically 2 (two) times daily.   Omega-3 Fatty Acids (FISH OIL) 1000 MG CAPS Take 1,000 mg by mouth 2 (two) times daily.   simvastatin  (ZOCOR ) 40 MG tablet TAKE 1 TABLET(40 MG) BY MOUTH DAILY   valsartan  (DIOVAN ) 160 MG tablet TAKE 1 TABLET(160 MG) BY MOUTH DAILY   meloxicam  (MOBIC ) 7.5 MG tablet Take 7.5 mg by mouth daily. (Patient not taking: Reported on 12/17/2023)   No facility-administered encounter medications on file as of 12/17/2023.    Allergies (verified) Lisinopril   History: Past Medical History:  Diagnosis Date   Hypertension, essential 06/16/2008   Impaired fasting glucose 06/26/2010   Mixed hyperlipidemia 08/03/2008   Right nephrolithiasis    Past Surgical History:  Procedure Laterality Date   CYSTOSCOPY  W/ URETERAL STENT PLACEMENT Right 10/28/2013   EYE SURGERY Left 11/2016   Cataract   EYE SURGERY Right 10/2016   Cataract   KNEE ARTHROSCOPY Left    LITHOTRIPSY     Family History  Problem Relation Age of Onset   Cancer Mother    Heart attack Father    Social History   Socioeconomic History   Marital status: Married    Spouse name: Not on file   Number of children: Not on file   Years of education: Not on file   Highest education level: Not on file  Occupational History   Occupation: [employer] doctor  Tobacco Use   Smoking status: Former    Current packs/day: 0.00    Types: Cigarettes    Quit date: 06/05/1979    Years since quitting: 44.5   Smokeless tobacco: Never  Vaping Use   Vaping status: Never Used  Substance and Sexual Activity   Alcohol use: Never   Drug use: Never   Sexual activity: Not on file  Other Topics Concern   Not on file  Social History Narrative   Not on file   Social Drivers of Health   Financial Resource Strain: Low Risk  (12/14/2022)   Overall Financial Resource Strain (CARDIA)    Difficulty of Paying Living Expenses: Not hard at all  Food Insecurity: No Food Insecurity (12/17/2023)   Hunger Vital Sign    Worried About Running Out of Food in  the Last Year: Never true    Ran Out of Food in the Last Year: Never true  Transportation Needs: No Transportation Needs (12/17/2023)   PRAPARE - Administrator, Civil Service (Medical): No    Lack of Transportation (Non-Medical): No  Physical Activity: Inactive (12/14/2022)   Exercise Vital Sign    Days of Exercise per Week: 0 days    Minutes of Exercise per Session: 0 min  Stress: No Stress Concern Present (12/14/2022)   [employer] of Occupational Health - Occupational Stress Questionnaire    Feeling of Stress : Not at all  Social Connections: Moderately Integrated (12/11/2021)   Social Connection and Isolation Panel    Frequency of Communication with Friends and Family: More than three  times a week    Frequency of Social Gatherings with Friends and Family: Once a week    Attends Religious Services: 1 to 4 times per year    Active Member of [employer] or Organizations: No    Attends Engineer, structural: Never    Marital Status: Married    Tobacco Counseling Counseling given: Not Answered   Clinical Intake:  Pre-visit preparation completed: Yes  Pain : No/denies pain Pain Score: 0-No pain     BMI - recorded: 31.93 Nutritional Risks: None Diabetes: No  How often do you need to have someone help you when you read instructions, pamphlets, or other written materials from your doctor or pharmacy?: 1 - Never What is the last grade level you completed in school?: 9TH  Interpreter Needed?: No      Activities of Daily Living    12/17/2023    7:52 AM  In your present state of health, do you have any difficulty performing the following activities:  Hearing? 1  Comment WEARS HEARING AIDS  Vision? 0  Difficulty concentrating or making decisions? 0  Walking or climbing stairs? 0  Dressing or bathing? 0  Doing errands, shopping? 0  Preparing Food and eating ? N  Using the Toilet? N  In the past six months, have you accidently leaked urine? N  Do you have problems with loss of bowel control? N  Managing your Medications? N  Managing your Finances? N  Housekeeping or managing your Housekeeping? N    Patient Care Team: Sherre Clapper, MD as PCP - General (Family Medicine) Renato Romberg, MD as Consulting Physician (Orthopedic Surgery) Towana Charleston, MD (Gastroenterology)  Indicate any recent Medical Services you may have received from other than [facility] providers in the past year (date may be approximate).     Assessment:   This is a routine wellness examination for Joel Harding.  Hearing/Vision screen No results found.   Goals Addressed             This Visit's Progress    Set My Weight Loss Goal       Follow Up Date 12/16/2024   - set weight  loss goal  - 20 pounds   Why is this important?  To feel a little better, feel groggy  Losing only 5 to 15 percent of your weight makes a big difference in your health.    Notes:  Aim to do some physical activity for 150 minutes per week. This is typically divided into 5 days per week, 30 minutes per day. The activity should be enough to get your heart rate up. Anything is better than nothing if you have time constraints.   Cut back on the ice cream at night  Depression Screen    12/17/2023    7:57 AM 06/21/2023    7:23 AM 12/14/2022    8:27 AM 12/11/2021    7:28 AM 06/12/2021    7:40 AM 11/21/2020    8:14 AM 11/02/2019    8:16 AM  PHQ 2/9 Scores  PHQ - 2 Score 0 0 0 0 0 0 0    Fall Risk    12/17/2023    7:56 AM 06/21/2023    7:23 AM 12/11/2021    7:27 AM 06/12/2021    7:40 AM 11/21/2020    8:14 AM  Fall Risk   Falls in the past year? 0 0 0 0 0  Number falls in past yr: 0 0 0 0 0  Injury with Fall? 0 0 0 0 0  Risk for fall due to : No Fall Risks No Fall Risks No Fall Risks  No Fall Risks  Follow up Falls evaluation completed  Falls evaluation completed  Falls evaluation completed  Falls evaluation completed      Data saved with a previous flowsheet row definition    MEDICARE RISK AT HOME: Medicare Risk at Home Any stairs in or around the home?: No If so, are there any without handrails?: No Home free of loose throw rugs in walkways, pet beds, electrical cords, etc?: Yes Adequate lighting in your home to reduce risk of falls?: Yes Life alert?: No Use of a cane, walker or w/c?: No Grab bars in the bathroom?: Yes Shower chair or bench in shower?: Yes Elevated toilet seat or a handicapped toilet?: No  TIMED UP AND GO:  Was the test performed?  Yes  Length of time to ambulate 10 feet: 8 sec Gait steady and fast without use of assistive device    Cognitive Function:    09/25/2019    9:21 AM  MMSE - Mini Mental State Exam  Orientation to time 5  Orientation to  Place 5  Registration 3  Attention/ Calculation 5  Recall 3  Language- name 2 objects 2  Language- repeat 1  Language- follow 3 step command 3  Language- read & follow direction 1  Write a sentence 1  Copy design 1  Total score 30        12/17/2023    7:57 AM  6CIT Screen  What Year? 0 points  What month? 0 points  What time? 0 points  Count back from 20 0 points  Months in reverse 0 points  Repeat phrase 0 points  Total Score 0 points    Immunizations Immunization History  Administered Date(s) Administered   Fluad Quad(high Dose 65+) 02/29/2020, 06/12/2021, 06/18/2022   Fluad Trivalent(High Dose 65+) 06/21/2023   Influenza-Unspecified 05/04/2019   Pneumococcal Conjugate-13 11/22/2014   Pneumococcal Polysaccharide-23 06/30/2012, 09/25/2019   Tdap 11/22/2014    TDAP status: Up to date  Flu Vaccine status: Up to date  Pneumococcal vaccine status: Up to date  Covid-19 vaccine status: Declined, Education has been provided regarding the importance of this vaccine but patient still declined. Advised may receive this vaccine at local pharmacy or Health Dept.or vaccine clinic. Aware to provide a copy of the vaccination record if obtained from local pharmacy or Health Dept. Verbalized acceptance and understanding.  Qualifies for Shingles Vaccine? Yes   Zostavax completed No   Shingrix Completed?: No.    Education has been provided regarding the importance of this vaccine. Patient has been advised to call insurance company to determine out of pocket expense if they  have not yet received this vaccine. Advised may also receive vaccine at local pharmacy or Health Dept. Verbalized acceptance and understanding.  Screening Tests Health Maintenance  Topic Date Due   Zoster Vaccines- Shingrix (1 of 2) Never done   INFLUENZA VACCINE  12/27/2023   DTaP/Tdap/Td (2 - Td or Tdap) 11/21/2024   Medicare Annual Wellness (AWV)  12/16/2024   Colonoscopy  04/13/2031   Pneumococcal Vaccine:  50+ Years  Completed   Hepatitis C Screening  Completed   Hepatitis B Vaccines  Aged Out   HPV VACCINES  Aged Out   Meningococcal B Vaccine  Aged Out   COVID-19 Vaccine  Discontinued    Health Maintenance  Health Maintenance Due  Topic Date Due   Zoster Vaccines- Shingrix (1 of 2) Never done    Colorectal cancer screening: Type of screening: Colonoscopy. Completed 04/12/2021. Repeat every 10 years  Lung Cancer Screening: (Low Dose CT Chest recommended if Age 86-80 years, 20 pack-year currently smoking OR have quit w/in 15years.) does not qualify.   Lung Cancer Screening Referral: n/a  Additional Screening:  Hepatitis C Screening: does not qualify; Completed 11/21/2020  Vision Screening: Recommended annual ophthalmology exams for early detection of glaucoma and other disorders of the eye. Is the patient up to date with their annual eye exam?  Yes  Who is the provider or what is the name of the office in which the patient attends annual eye exams? [facility] If pt is not established with a provider, would they like to be referred to a provider to establish care? N/A.   Dental Screening: Recommended annual dental exams for proper oral hygiene  Diabetic Foot Exam: N/A  Community Resource Referral / Chronic Care Management: CRR required this visit?  No   CCM required this visit?  No     Plan:   Encounter for Medicare annual wellness exam Assessment & Plan: Weight Management He gained weight to 240 pounds from 221 pounds. Aims to lose 20 pounds for health improvement. - Encouraged dietary modifications, reduce ice cream.  General Health Maintenance Up to date with screenings. Requires shingles vaccination. - Administer shingles vaccination at pharmacy. - Perform lab work today for review at Friday's appointment with Doctor Cox.      Things to do to keep yourself healthy  - Exercise at least 30-45 minutes a day, 3-4 days a week.  - Eat a low-fat diet with lots of  fruits and vegetables, up to 7-9 servings per day.  - Seatbelts can save your life. Wear them always.  - Smoke detectors on every level of your home, check batteries every year.  - Eye Doctor - have an eye exam every 1-2 years  - Alcohol -  If you drink, do it moderately, less than 2 drinks per day.  - Health Care Power of Attorney. Choose someone to speak for you if you are not able.  - Depression is common in our stressful world.If you're feeling down or losing interest in things you normally enjoy, please come in for a visit.  - Violence - If anyone is threatening or hurting you, please call immediately.    Hypertension, essential Assessment & Plan: Well controlled.  BP Readings from Last 3 Encounters:  12/17/23 106/68  06/22/23 (!) 138/90  06/21/23 132/80    No changes to medicines. Valsartan  160mg  1 tablet daily Continue to work on eating a healthy diet and exercise.  Labs drawn today.    Orders: -  Comprehensive metabolic panel with GFR -     CBC with Differential/Platelet  Mixed hyperlipidemia Assessment & Plan: Well controlled.  Lab Results  Component Value Date   LDLCALC 98 06/21/2023    No changes to medicines. Simvastatin  40mg  1 tablet once daily, Fish Oil 1,000mg  2 capsules BID Continue to work on eating a healthy diet and exercise.  Labs drawn today.    Orders: -     Lipid panel  Hyperkalemia Assessment & Plan: Resolved Lab Results  Component Value Date   K 5.1 06/25/2023  - Labs drawn today   Impaired fasting glucose Assessment & Plan: Prediabetes Recommend continue to work on eating healthy diet and exercise. Lab Results  Component Value Date   HGBA1C 5.9 (H) 06/21/2023   Labs drawn today, Await labs/testing for assessment and recommendations   Orders: -     Hemoglobin A1c     I have personally reviewed and noted the following in the patient's chart:   Medical and social history Use of alcohol, tobacco or illicit drugs  Current  medications and supplements including opioid prescriptions. Patient is not currently taking opioid prescriptions. Functional ability and status Nutritional status Physical activity Advanced directives List of other physicians Hospitalizations, surgeries, and ER visits in previous 12 months Vitals Screenings to include cognitive, depression, and falls Referrals and appointments  In addition, I have reviewed and discussed with patient certain preventive protocols, quality metrics, and best practice recommendations. A written personalized care plan for preventive services as well as general preventive health recommendations were provided to patient.    Harrie Cedar, FNP [facility] [phone]    12/17/2023   After Visit Summary: (In Person-Printed) AVS printed and given to the patient

## 2023-12-17 NOTE — Assessment & Plan Note (Signed)
 Prediabetes Recommend continue to work on eating healthy diet and exercise. Lab Results  Component Value Date   HGBA1C 5.9 (H) 06/21/2023   Labs drawn today, Await labs/testing for assessment and recommendations

## 2023-12-17 NOTE — Assessment & Plan Note (Signed)
 Well controlled.  Lab Results  Component Value Date   LDLCALC 98 06/21/2023    No changes to medicines. Simvastatin  40mg  1 tablet once daily, Fish Oil 1,000mg  2 capsules BID Continue to work on eating a healthy diet and exercise.  Labs drawn today.

## 2023-12-18 ENCOUNTER — Ambulatory Visit: Payer: Self-pay | Admitting: Family Medicine

## 2023-12-19 NOTE — Progress Notes (Unsigned)
 Subjective:  Patient ID: Joel Harding, male    DOB: [birth year]  Age: 73 y.o. MRN: [id number]  No chief complaint on file.   HPI:  He is currently taking simvastatin , fish oil, and valsartan  for cholesterol and blood pressure management, respectively, and Famvir . He maintains an active lifestyle, including walking and running.   Blood count normal.  Liver function normal. Kidney function stable.  Lipids: normal A1C: 6.3%     12/17/2023    7:57 AM 06/21/2023    7:23 AM 12/14/2022    8:27 AM 12/11/2021    7:28 AM 06/12/2021    7:40 AM  Depression screen PHQ 2/9  Decreased Interest 0 0 0 0 0  Down, Depressed, Hopeless 0 0 0 0 0  PHQ - 2 Score 0 0 0 0 0        12/17/2023    7:56 AM  Fall Risk   Falls in the past year? 0  Number falls in past yr: 0  Injury with Fall? 0  Risk for fall due to : No Fall Risks  Follow up Falls evaluation completed    Patient Care Team: Sherre Clapper, MD as PCP - General (Family Medicine) Renato Romberg, MD as Consulting Physician (Orthopedic Surgery) Towana Charleston, MD (Gastroenterology)   Review of Systems  Constitutional:  Negative for chills, diaphoresis, fatigue and fever.  HENT:  Negative for congestion, ear pain and sore throat.   Respiratory:  Negative for cough and shortness of breath.   Cardiovascular:  Negative for chest pain and leg swelling.  Gastrointestinal:  Negative for abdominal pain, constipation, diarrhea, nausea and vomiting.  Genitourinary:  Negative for dysuria and urgency.  Musculoskeletal:  Negative for arthralgias and myalgias.  Neurological:  Negative for dizziness and headaches.  Psychiatric/Behavioral:  Negative for dysphoric mood.     Current Outpatient Medications on File Prior to Visit  Medication Sig Dispense Refill   famciclovir  (FAMVIR ) 250 MG tablet TAKE 1 TABLET BY MOUTH TWICE DAILY 180 tablet 3   meloxicam  (MOBIC ) 7.5 MG tablet Take 7.5 mg by mouth daily. (Patient not taking: Reported on 12/17/2023)      nystatin  powder Apply 1 Application topically 2 (two) times daily. 60 g 3   Omega-3 Fatty Acids (FISH OIL) 1000 MG CAPS Take 1,000 mg by mouth 2 (two) times daily.     simvastatin  (ZOCOR ) 40 MG tablet TAKE 1 TABLET(40 MG) BY MOUTH DAILY 90 tablet 1   valsartan  (DIOVAN ) 160 MG tablet TAKE 1 TABLET(160 MG) BY MOUTH DAILY 90 tablet 1   No current facility-administered medications on file prior to visit.   Past Medical History:  Diagnosis Date   Hypertension, essential 06/16/2008   Impaired fasting glucose 06/26/2010   Mixed hyperlipidemia 08/03/2008   Right nephrolithiasis    Past Surgical History:  Procedure Laterality Date   CYSTOSCOPY W/ URETERAL STENT PLACEMENT Right 10/28/2013   EYE SURGERY Left 11/2016   Cataract   EYE SURGERY Right 10/2016   Cataract   KNEE ARTHROSCOPY Left    LITHOTRIPSY      Family History  Problem Relation Age of Onset   Cancer Mother    Heart attack Father    Social History   Socioeconomic History   Marital status: Married    Spouse name: Not on file   Number of children: Not on file   Years of education: Not on file   Highest education level: Not on file  Occupational History   Occupation: [employer] doctor  Tobacco  Use   Smoking status: Former    Current packs/day: 0.00    Types: Cigarettes    Quit date: 06/05/1979    Years since quitting: 44.5   Smokeless tobacco: Never  Vaping Use   Vaping status: Never Used  Substance and Sexual Activity   Alcohol use: Never   Drug use: Never   Sexual activity: Not on file  Other Topics Concern   Not on file  Social History Narrative   Not on file   Social Drivers of Health   Financial Resource Strain: Low Risk  (12/14/2022)   Overall Financial Resource Strain (CARDIA)    Difficulty of Paying Living Expenses: Not hard at all  Food Insecurity: No Food Insecurity (12/17/2023)   Hunger Vital Sign    Worried About Running Out of Food in the Last Year: Never true    Ran Out of Food in the Last Year:  Never true  Transportation Needs: No Transportation Needs (12/17/2023)   PRAPARE - Administrator, Civil Service (Medical): No    Lack of Transportation (Non-Medical): No  Physical Activity: Inactive (12/14/2022)   Exercise Vital Sign    Days of Exercise per Week: 0 days    Minutes of Exercise per Session: 0 min  Stress: No Stress Concern Present (12/14/2022)   [employer] of Occupational Health - Occupational Stress Questionnaire    Feeling of Stress : Not at all  Social Connections: Moderately Integrated (12/11/2021)   Social Connection and Isolation Panel    Frequency of Communication with Friends and Family: More than three times a week    Frequency of Social Gatherings with Friends and Family: Once a week    Attends Religious Services: 1 to 4 times per year    Active Member of [employer] or Organizations: No    Attends Banker Meetings: Never    Marital Status: Married    Objective:  There were no vitals taken for this visit.     12/17/2023    7:48 AM 06/22/2023   12:30 PM 06/22/2023   12:28 PM  BP/Weight  Systolic BP 106 138   Diastolic BP 68 90   Wt. (Lbs) 242  230  BMI 31.93 kg/m2  30.34 kg/m2    Physical Exam Vitals reviewed.  Constitutional:      Appearance: Normal appearance. He is normal weight.  Cardiovascular:     Rate and Rhythm: Normal rate and regular rhythm.     Heart sounds: No murmur heard. Pulmonary:     Effort: Pulmonary effort is normal.     Breath sounds: Normal breath sounds.  Abdominal:     General: Abdomen is flat. Bowel sounds are normal.     Palpations: Abdomen is soft.     Tenderness: There is no abdominal tenderness.  Neurological:     Mental Status: He is alert and oriented to person, place, and time.  Psychiatric:        Mood and Affect: Mood normal.        Behavior: Behavior normal.     {Perform Simple Foot Exam  Perform Detailed exam:1} {Insert foot Exam (Optional):30965}   Lab Results  Component  Value Date   WBC 7.1 12/17/2023   HGB 15.6 12/17/2023   HCT 45.8 12/17/2023   PLT 186 12/17/2023   GLUCOSE 122 (H) 12/17/2023   CHOL 155 12/17/2023   TRIG 125 12/17/2023   HDL 41 12/17/2023   LDLCALC 92 12/17/2023   ALT 15 12/17/2023  AST 17 12/17/2023   NA 139 12/17/2023   K 4.9 12/17/2023   CL 103 12/17/2023   CREATININE 1.44 (H) 12/17/2023   BUN 20 12/17/2023   CO2 22 12/17/2023   TSH 1.730 12/14/2022   HGBA1C 6.3 (H) 12/17/2023   MICROALBUR 150 11/21/2020      Assessment & Plan:  There are no diagnoses linked to this encounter.   No orders of the defined types were placed in this encounter.   No orders of the defined types were placed in this encounter.    Follow-up: No follow-ups on file.   I,Katherina A Bramblett,acting as a scribe for Abigail Free, MD.,have documented all relevant documentation on the behalf of Abigail Free, MD,as directed by  Abigail Free, MD while in the presence of Abigail Free, MD.   An After Visit Summary was printed and given to the patient.  Abigail Free, MD [facility] [phone]

## 2023-12-20 ENCOUNTER — Ambulatory Visit (INDEPENDENT_AMBULATORY_CARE_PROVIDER_SITE_OTHER): Payer: Medicare Other | Admitting: Family Medicine

## 2023-12-20 ENCOUNTER — Encounter: Payer: Self-pay | Admitting: Family Medicine

## 2023-12-20 VITALS — BP 132/78 | HR 78 | Temp 98.0°F | Ht 72.0 in | Wt 241.0 lb

## 2023-12-20 DIAGNOSIS — N1831 Chronic kidney disease, stage 3a: Secondary | ICD-10-CM

## 2023-12-20 DIAGNOSIS — Z6832 Body mass index (BMI) 32.0-32.9, adult: Secondary | ICD-10-CM

## 2023-12-20 DIAGNOSIS — I10 Essential (primary) hypertension: Secondary | ICD-10-CM | POA: Diagnosis not present

## 2023-12-20 DIAGNOSIS — E66811 Obesity, class 1: Secondary | ICD-10-CM

## 2023-12-20 DIAGNOSIS — R7301 Impaired fasting glucose: Secondary | ICD-10-CM | POA: Diagnosis not present

## 2023-12-20 DIAGNOSIS — E782 Mixed hyperlipidemia: Secondary | ICD-10-CM | POA: Diagnosis not present

## 2023-12-20 DIAGNOSIS — E6609 Other obesity due to excess calories: Secondary | ICD-10-CM

## 2023-12-20 MED ORDER — VALSARTAN 160 MG PO TABS: 160.0000 mg | ORAL_TABLET | Freq: Every day | ORAL | 1 refills | Status: DC

## 2023-12-20 MED ORDER — SIMVASTATIN 40 MG PO TABS: ORAL_TABLET | ORAL | 1 refills | Status: DC

## 2023-12-20 NOTE — Patient Instructions (Signed)
 Due your abnormal kidney function, although stable, I recommend avoidance of all nonsteroid antiinflammatories, such as naproxen (aleve) or ibuprofen (advil.) Use tylenol if needed for pain management.

## 2023-12-20 NOTE — Assessment & Plan Note (Signed)
 Well controlled.  Lab Results  Component Value Date   LDLCALC 92 12/17/2023    No changes to medicines. Simvastatin  40mg  1 tablet once daily, Fish Oil 1,000mg  2 capsules BID Continue to work on eating a healthy diet and exercise.  Labs reviewed today.

## 2023-12-20 NOTE — Assessment & Plan Note (Signed)
 Recommend continue to work on eating healthy diet and exercise.

## 2023-12-20 NOTE — Assessment & Plan Note (Signed)
 Well controlled.  BP Readings from Last 3 Encounters:  12/20/23 132/78  12/17/23 106/68  06/22/23 (!) 138/90    No changes to medicines. Valsartan  160mg  1 tablet daily Continue to work on eating a healthy diet and exercise.  Labs reviewed today.

## 2023-12-20 NOTE — Assessment & Plan Note (Signed)
 Stable. No nonsteroid antiinflammatories, such as naproxen (aleve) or ibuprofen (advil.).

## 2024-01-19 DIAGNOSIS — J069 Acute upper respiratory infection, unspecified: Secondary | ICD-10-CM | POA: Diagnosis not present

## 2024-01-19 DIAGNOSIS — R1110 Vomiting, unspecified: Secondary | ICD-10-CM | POA: Diagnosis not present

## 2024-01-19 DIAGNOSIS — R112 Nausea with vomiting, unspecified: Secondary | ICD-10-CM | POA: Diagnosis not present

## 2024-01-19 DIAGNOSIS — R110 Nausea: Secondary | ICD-10-CM | POA: Diagnosis not present

## 2024-01-19 DIAGNOSIS — Z20822 Contact with and (suspected) exposure to covid-19: Secondary | ICD-10-CM | POA: Diagnosis not present

## 2024-01-19 DIAGNOSIS — R070 Pain in throat: Secondary | ICD-10-CM | POA: Diagnosis not present

## 2024-01-19 DIAGNOSIS — G44099 Other trigeminal autonomic cephalgias (TAC), not intractable: Secondary | ICD-10-CM | POA: Diagnosis not present

## 2024-01-19 DIAGNOSIS — R051 Acute cough: Secondary | ICD-10-CM | POA: Diagnosis not present

## 2024-01-19 DIAGNOSIS — R519 Headache, unspecified: Secondary | ICD-10-CM | POA: Diagnosis not present

## 2024-01-21 DIAGNOSIS — I491 Atrial premature depolarization: Secondary | ICD-10-CM | POA: Diagnosis not present

## 2024-01-21 DIAGNOSIS — J984 Other disorders of lung: Secondary | ICD-10-CM | POA: Diagnosis not present

## 2024-01-23 ENCOUNTER — Ambulatory Visit: Payer: Self-pay | Admitting: Family Medicine

## 2024-01-28 ENCOUNTER — Inpatient Hospital Stay

## 2024-02-08 DIAGNOSIS — R0981 Nasal congestion: Secondary | ICD-10-CM | POA: Diagnosis not present

## 2024-02-08 DIAGNOSIS — R071 Chest pain on breathing: Secondary | ICD-10-CM | POA: Diagnosis not present

## 2024-02-26 ENCOUNTER — Other Ambulatory Visit: Payer: Self-pay

## 2024-02-26 DIAGNOSIS — I10 Essential (primary) hypertension: Secondary | ICD-10-CM

## 2024-02-26 MED ORDER — VALSARTAN 160 MG PO TABS: 160.0000 mg | ORAL_TABLET | Freq: Every day | ORAL | 1 refills | Status: AC

## 2024-03-02 DIAGNOSIS — K080 Exfoliation of teeth due to systemic causes: Secondary | ICD-10-CM | POA: Diagnosis not present

## 2024-03-11 DIAGNOSIS — M25512 Pain in left shoulder: Secondary | ICD-10-CM | POA: Diagnosis not present

## 2024-03-18 ENCOUNTER — Telehealth: Payer: Self-pay

## 2024-03-18 NOTE — Telephone Encounter (Signed)
 Left message for patient to call our office.  Copied from CRM #[id number]. Topic: Clinical - Medication Question >> Mar 18, 2024 11:34 AM Travis F wrote: Reason for CRM: Garrel, a clinical pharmacist with [facility] is calling in checking on the status of patient's simvastatin  (ZOCOR ) 40 MG tablet [[id number]]. He says he hasn't been able to get in touch with the patient. Garrel says he believes patient is taking the medication incorrectly and he is at risk with his insurance because of it. He is requesting the office reach out to patient to see if he is still taking the medication. Garrel says to call him with any questions. [phone] ext [phone]

## 2024-03-19 MED ORDER — SIMVASTATIN 40 MG PO TABS: ORAL_TABLET | ORAL | 1 refills | Status: DC

## 2024-03-19 NOTE — Addendum Note (Signed)
 Addended by: Tiago Humphrey L on: 03/19/2024 10:33 AM   Modules accepted: Orders

## 2024-03-19 NOTE — Telephone Encounter (Signed)
 Called patient and he stated he is taking his simvastatin  40 mg daily, and he does need a refill.  Made patient aware, I will send refill to requested pharmacy.

## 2024-03-25 DIAGNOSIS — M7541 Impingement syndrome of right shoulder: Secondary | ICD-10-CM | POA: Diagnosis not present

## 2024-06-16 LAB — LAB REPORT - SCANNED
A1c: 6.5
EGFR: 66
TSH: 1.69 (ref 0.41–5.90)

## 2024-06-26 ENCOUNTER — Ambulatory Visit: Admitting: Family Medicine

## 2024-06-26 VITALS — BP 128/74 | HR 80 | Temp 97.8°F | Ht 73.0 in | Wt 235.0 lb

## 2024-06-26 DIAGNOSIS — E782 Mixed hyperlipidemia: Secondary | ICD-10-CM | POA: Diagnosis not present

## 2024-06-26 DIAGNOSIS — M79641 Pain in right hand: Secondary | ICD-10-CM

## 2024-06-26 DIAGNOSIS — M25551 Pain in right hip: Secondary | ICD-10-CM

## 2024-06-26 DIAGNOSIS — I10 Essential (primary) hypertension: Secondary | ICD-10-CM | POA: Diagnosis not present

## 2024-06-26 DIAGNOSIS — Z23 Encounter for immunization: Secondary | ICD-10-CM | POA: Diagnosis not present

## 2024-06-26 DIAGNOSIS — E119 Type 2 diabetes mellitus without complications: Secondary | ICD-10-CM

## 2024-06-26 DIAGNOSIS — N1831 Chronic kidney disease, stage 3a: Secondary | ICD-10-CM

## 2024-06-26 DIAGNOSIS — M19011 Primary osteoarthritis, right shoulder: Secondary | ICD-10-CM

## 2024-06-26 DIAGNOSIS — M19012 Primary osteoarthritis, left shoulder: Secondary | ICD-10-CM

## 2024-06-26 DIAGNOSIS — E1122 Type 2 diabetes mellitus with diabetic chronic kidney disease: Secondary | ICD-10-CM | POA: Diagnosis not present

## 2024-06-26 DIAGNOSIS — M1712 Unilateral primary osteoarthritis, left knee: Secondary | ICD-10-CM | POA: Diagnosis not present

## 2024-06-26 DIAGNOSIS — G5603 Carpal tunnel syndrome, bilateral upper limbs: Secondary | ICD-10-CM

## 2024-06-26 DIAGNOSIS — M25552 Pain in left hip: Secondary | ICD-10-CM | POA: Diagnosis not present

## 2024-06-26 DIAGNOSIS — M79642 Pain in left hand: Secondary | ICD-10-CM | POA: Diagnosis not present

## 2024-06-26 DIAGNOSIS — R7301 Impaired fasting glucose: Secondary | ICD-10-CM

## 2024-06-26 LAB — POCT LIPID PANEL
HDL: 43
LDL: 109
Non-HDL: 133
TC/HDL: 2.5
TC: 176
TRG: 117

## 2024-06-26 MED ORDER — ROSUVASTATIN CALCIUM 20 MG PO TABS: 20.0000 mg | ORAL_TABLET | Freq: Every day | ORAL | 0 refills | Status: AC

## 2024-06-26 MED ORDER — MELOXICAM 15 MG PO TABS: 15.0000 mg | ORAL_TABLET | Freq: Every day | ORAL | 1 refills | Status: AC

## 2024-06-26 NOTE — Assessment & Plan Note (Signed)
 Joel Harding

## 2024-06-26 NOTE — Assessment & Plan Note (Signed)
 SABRA

## 2024-06-26 NOTE — Assessment & Plan Note (Signed)
°  Orders:   POCT Lipid Panel

## 2024-06-26 NOTE — Patient Instructions (Signed)
" °  VISIT SUMMARY: During your visit, we discussed your joint pain, numbness in your hands, and other health concerns. We reviewed your shoulder, hip, and leg pain, as well as your hand symptoms. We also addressed your diabetes, cholesterol levels, and blood pressure management.  YOUR PLAN: PRIMARY OSTEOARTHRITIS OF BILATERAL SHOULDERS: Chronic bilateral shoulder pain likely due to osteoarthritis. -management by orthopedics.   ARTHRALGIA OF HANDS, HIPS, AND KNEES: Hip and knee pain likely due to osteoarthritis. -Request orthopedic evaluation for hips and knees during upcoming appointment. - recommend x-rays at orthopedics. -Start on meloxicam  15 mg daily. No other nonsteroid antiinflammatories, such as naproxen (aleve) or ibuprofen (advil.). may take tylenol.   HAND PAIN: - ruling out rheumatoid arthritis.  - labs drawn today.  -Start on meloxicam  15 mg daily. No other nonsteroid antiinflammatories, such as naproxen (aleve) or ibuprofen (advil.). may take tylenol.   CARPAL TUNNEL SYNDROME, BILATERAL UPPER LIMBS SUSPECTED:  Bilateral hand pain with tingling, suggestive of carpal tunnel syndrome. -Use over-the-counter carpal tunnel braces. -Review the information provided on carpal tunnel syndrome and management options. -Start on meloxicam  15 mg daily. No other nonsteroid antiinflammatories, such as naproxen (aleve) or ibuprofen (advil.). may take tylenol.   TYPE 2 DIABETES MELLITUS: A1c increased to 6.5, indicating progression to type 2 diabetes mellitus. -Focus on aggressive dietary management and exercise as tolerated. -Arthritis panel ordered to rule out rheumatoid arthritis.  MIXED HYPERLIPIDEMIA: Elevated cholesterol levels with total cholesterol at 176 and LDL at 109. -Start Crestor  at 20 mg daily. -Recheck liver function  and cholesterol in three months.  ESSENTIAL HYPERTENSION: Blood pressure well-controlled on current regimen of valsartan . -Continue current medication  regimen.  CHRONIC KIDNEY DISEASE, STAGE 3A: CKD stage 3a with GFR 45-59 ml/min. -Monitor kidney function regularly.  GENERAL HEALTH MAINTENANCE: Routine health maintenance discussed, including recent shingles vaccination. -Continue with routine health maintenance.    Contains text generated by [employer].   "

## 2024-06-27 DIAGNOSIS — M79641 Pain in right hand: Secondary | ICD-10-CM | POA: Insufficient documentation

## 2024-06-27 DIAGNOSIS — M25551 Pain in right hip: Secondary | ICD-10-CM | POA: Insufficient documentation

## 2024-06-27 DIAGNOSIS — G5603 Carpal tunnel syndrome, bilateral upper limbs: Secondary | ICD-10-CM | POA: Insufficient documentation

## 2024-06-27 NOTE — Assessment & Plan Note (Signed)
 CKD stage 3a with GFR 45-59 ml/min.

## 2024-06-27 NOTE — Assessment & Plan Note (Signed)
 Bilateral hand pain with tingling, suggestive of carpal tunnel syndrome. Urgent care suggested possible need for surgery. - Recommended over-the-counter carpal tunnel braces. - Provided information on carpal tunnel syndrome and management options.

## 2024-06-27 NOTE — Assessment & Plan Note (Addendum)
 Bilateral hand pain with tingling, suggestive of carpal tunnel syndrome. Urgent care suggested possible need for surgery. - Recommended over-the-counter carpal tunnel braces. - Provided information on carpal tunnel syndrome and management options. Check labs to rule out RHEUMATOID ARTHRITIS.  Orders:   Rheumatoid factor   CYCLIC CITRUL PEPTIDE ANTIBODY, IGG/IGA   Sedimentation rate   C-reactive protein

## 2024-06-27 NOTE — Assessment & Plan Note (Signed)
 Hip and knee pain likely due to osteoarthritis. No recent imaging. Orthopedic consultation ongoing. - Requested orthopedic evaluation for hips and knees during upcoming appointment. - Consider x-rays if recommended by orthopedic specialist.

## 2024-06-29 LAB — SEDIMENTATION RATE: Sed Rate: 11 mm/h (ref 0–30)

## 2024-06-29 LAB — CYCLIC CITRUL PEPTIDE ANTIBODY, IGG/IGA: Cyclic Citrullin Peptide Ab: 7 U (ref 0–19)

## 2024-06-29 LAB — C-REACTIVE PROTEIN: CRP: 4 mg/L (ref 0–10)

## 2024-06-29 LAB — RHEUMATOID FACTOR: Rheumatoid fact SerPl-aCnc: <10 [IU]/mL

## 2024-06-30 ENCOUNTER — Ambulatory Visit: Payer: Self-pay | Admitting: Family Medicine

## 2024-06-30 ENCOUNTER — Encounter: Payer: Self-pay | Admitting: Family Medicine

## 2024-09-15 ENCOUNTER — Ambulatory Visit: Admitting: Family Medicine

## 2024-12-17 ENCOUNTER — Ambulatory Visit

## 2024-12-23 ENCOUNTER — Ambulatory Visit
